# Patient Record
Sex: Female | Born: 1975 | Race: White | Hispanic: No | Smoking: Current every day smoker
Health system: Southern US, Community
[De-identification: ages and names within clinical notes are randomized; demographics above are authoritative.]

## PROBLEM LIST (undated history)

## (undated) HISTORY — PX: ABDOMINAL HYSTERECTOMY: SHX81

---

## 2004-07-18 ENCOUNTER — Emergency Department: Payer: Self-pay | Admitting: Unknown Physician Specialty

## 2004-07-19 ENCOUNTER — Ambulatory Visit: Payer: Self-pay

## 2004-09-22 ENCOUNTER — Emergency Department: Payer: Self-pay | Admitting: General Practice

## 2004-11-26 ENCOUNTER — Emergency Department: Payer: Self-pay | Admitting: Internal Medicine

## 2005-03-19 ENCOUNTER — Emergency Department: Payer: Self-pay | Admitting: Emergency Medicine

## 2005-04-23 ENCOUNTER — Emergency Department: Payer: Self-pay | Admitting: Emergency Medicine

## 2005-06-30 ENCOUNTER — Emergency Department: Payer: Self-pay | Admitting: Emergency Medicine

## 2005-10-26 ENCOUNTER — Emergency Department: Payer: Self-pay | Admitting: Emergency Medicine

## 2005-10-26 ENCOUNTER — Emergency Department: Payer: Self-pay | Admitting: Unknown Physician Specialty

## 2005-11-27 ENCOUNTER — Emergency Department: Payer: Self-pay | Admitting: General Practice

## 2007-03-02 ENCOUNTER — Emergency Department: Payer: Self-pay | Admitting: Emergency Medicine

## 2007-08-01 ENCOUNTER — Emergency Department: Payer: Self-pay | Admitting: Emergency Medicine

## 2007-12-10 ENCOUNTER — Emergency Department: Payer: Self-pay | Admitting: Emergency Medicine

## 2007-12-10 ENCOUNTER — Other Ambulatory Visit: Payer: Self-pay

## 2008-04-06 ENCOUNTER — Emergency Department: Payer: Self-pay | Admitting: Emergency Medicine

## 2009-02-27 ENCOUNTER — Emergency Department: Payer: Self-pay | Admitting: Internal Medicine

## 2010-01-06 ENCOUNTER — Emergency Department: Payer: Self-pay | Admitting: Emergency Medicine

## 2011-03-16 ENCOUNTER — Ambulatory Visit: Payer: Self-pay | Admitting: Family Medicine

## 2012-01-19 ENCOUNTER — Ambulatory Visit: Payer: Self-pay

## 2017-03-16 ENCOUNTER — Emergency Department
Admission: EM | Admit: 2017-03-16 | Discharge: 2017-03-16 | Disposition: A | Discharge status: Home / Self Care | Payer: Medicaid Other | Attending: Emergency Medicine | Admitting: Emergency Medicine

## 2017-03-16 ENCOUNTER — Encounter: Payer: Self-pay | Admitting: Emergency Medicine

## 2017-03-16 DIAGNOSIS — F17210 Nicotine dependence, cigarettes, uncomplicated: Secondary | ICD-10-CM | POA: Insufficient documentation

## 2017-03-16 DIAGNOSIS — L0231 Cutaneous abscess of buttock: Secondary | ICD-10-CM | POA: Insufficient documentation

## 2017-03-16 DIAGNOSIS — L0291 Cutaneous abscess, unspecified: Secondary | ICD-10-CM

## 2017-03-16 MED ORDER — CLINDAMYCIN PHOSPHATE 600 MG/4ML IJ SOLN
600.0000 mg | Freq: Once | INTRAMUSCULAR | Status: AC
  Administered 2017-03-16: 600 mg via INTRAMUSCULAR
  Filled 2017-03-16: qty 4

## 2017-03-16 MED ORDER — CLINDAMYCIN HCL 300 MG PO CAPS: 300.0000 mg | ORAL_CAPSULE | Freq: Three times a day (TID) | ORAL | 0 refills | Status: AC

## 2017-03-16 NOTE — ED Provider Notes (Signed)
[facility] Emergency Department Provider Note  ____________________________________________  Time seen: Approximately 2:18 PM  I have reviewed the triage vital signs and the nursing notes.   HISTORY  Chief Complaint Abscess    HPI Kelly Jacobs is a 41 y.o. female that presents to emergency department with abscess to left buttocks for 1 week. Area is painful. She went to urgent care 3 days ago and was given Bactrim, which is not helping. She denies fever, chills, nausea, vomiting, abdominal pain, drainage from site.   History reviewed. No pertinent past medical history.  There are no active problems to display for this patient.   Past Surgical History:  Procedure Laterality Date  . ABDOMINAL HYSTERECTOMY      Prior to Admission medications   Medication Sig Start Date End Date Taking? Authorizing Provider  clindamycin (CLEOCIN) 300 MG capsule Take 1 capsule (300 mg total) by mouth 3 (three) times daily. 03/16/17 03/26/17  Enid DerryWagner, Noor Vidales, PA-C    Allergies Macrobid [nitrofurantoin]  No family history on file.  Social History Social History  Substance Use Topics  . Smoking status: Current Every Day Smoker    Packs/day: 0.50    Types: Cigarettes  . Smokeless tobacco: Never Used  . Alcohol use No     Review of Systems  Constitutional: No fever/chills Cardiovascular: No chest pain. Respiratory: No SOB. Gastrointestinal: No abdominal pain.  No nausea, no vomiting.  Skin: Negative for  abrasions, lacerations, ecchymosis.   ____________________________________________   PHYSICAL EXAM:  VITAL SIGNS: ED Triage Vitals [03/16/17 1259]  Enc Vitals Group     BP 127/66     Pulse Rate 73     Resp 17     Temp 98.1 F (36.7 C)     Temp Source Oral     SpO2 100 %     Weight 118 lb (53.5 kg)     Height 5\' 4"  (1.626 m)     Head Circumference      Peak Flow      Pain Score 9     Pain Loc      Pain Edu?      Excl. in GC?       Constitutional: Alert and oriented. Well appearing and in no acute distress. Eyes: Conjunctivae are normal. PERRL. EOMI. Head: Atraumatic. ENT:      Ears:      Nose: No congestion/rhinnorhea.      Mouth/Throat: Mucous membranes are moist.  Neck: No stridor.  Cardiovascular: Normal rate, regular rhythm.  Good peripheral circulation. Respiratory: Normal respiratory effort without tachypnea or retractions. Lungs CTAB. Good air entry to the bases with no decreased or absent breath sounds. Musculoskeletal: Full range of motion to all extremities. No gross deformities appreciated. Neurologic:  Normal speech and language. No gross focal neurologic deficits are appreciated.  Skin:  Skin is warm, dry. 1 cm x 1 cm area of swelling and fluctuance with active yellow drainage with 2 inches of surrounding erythema to left buttocks.   ____________________________________________   LABS (all labs ordered are listed, but only abnormal results are displayed)  Labs Reviewed - No data to display ____________________________________________  EKG   ____________________________________________  RADIOLOGY  No results found.  ____________________________________________    PROCEDURES  Procedure(s) performed:    Procedures    Medications  clindamycin (CLEOCIN) injection 600 mg (600 mg Intramuscular Given 03/16/17 1423)     ____________________________________________   INITIAL IMPRESSION / ASSESSMENT AND PLAN / ED COURSE  Pertinent labs &  imaging results that were available during my care of the patient were reviewed by me and considered in my medical decision making (see chart for details).  Review of the [place] CSRS was performed in accordance of the [facility] prior to dispensing any controlled drugs.  Patient's diagnosis is consistent with abscess. Vital signs and exam are reassuring. Abscess started actively draining in ED and drained about 5 ccs. Patient was given IM clindamycin  and will be sent home with a prescription for clindamycin. She is taking Bactrim and also continue this prescription. Patient is to follow up with PCP as directed. Patient is given ED precautions to return to the ED for any worsening or new symptoms.     ____________________________________________  FINAL CLINICAL IMPRESSION(S) / ED DIAGNOSES  Final diagnoses:  Abscess      NEW MEDICATIONS STARTED DURING THIS VISIT:  Discharge Medication List as of 03/16/2017  2:13 PM    START taking these medications   Details  clindamycin (CLEOCIN) 300 MG capsule Take 1 capsule (300 mg total) by mouth 3 (three) times daily., Starting Thu 03/16/2017, Until Sun 03/26/2017, Print            This chart was dictated using voice recognition software/Dragon. Despite best efforts to proofread, errors can occur which can change the meaning. Any change was purely unintentional.    Enid Derry, PA-C 03/16/17 1545    Emily Filbert, MD 03/17/17 [phone]

## 2017-03-16 NOTE — ED Triage Notes (Signed)
Patient presents to ED via POV from home with c/o abscess to left medial buttock. Redness noted around affected area.

## 2017-03-16 NOTE — ED Notes (Signed)
Medication coming from pharmacy. Pt updated.

## 2017-03-16 NOTE — ED Notes (Signed)
Pt states abscess x 1 week on L buttocks. Red and swollen. States already on antibiotic but "the pain isn't getting better." pt appears uncomfortable.

## 2017-08-24 ENCOUNTER — Other Ambulatory Visit: Payer: Self-pay | Admitting: Family Medicine

## 2017-08-24 DIAGNOSIS — Z1231 Encounter for screening mammogram for malignant neoplasm of breast: Secondary | ICD-10-CM

## 2017-09-05 ENCOUNTER — Inpatient Hospital Stay: Admission: RE | Admit: 2017-09-05 | Payer: Medicaid Other | Source: Ambulatory Visit

## 2017-09-12 ENCOUNTER — Inpatient Hospital Stay: Admission: RE | Admit: 2017-09-12 | Payer: Medicaid Other | Source: Ambulatory Visit

## 2017-10-04 ENCOUNTER — Encounter: Payer: Self-pay | Admitting: Emergency Medicine

## 2017-10-04 ENCOUNTER — Emergency Department
Admission: EM | Admit: 2017-10-04 | Discharge: 2017-10-05 | Disposition: A | Discharge status: Home / Self Care | Payer: Self-pay | Attending: Emergency Medicine | Admitting: Emergency Medicine

## 2017-10-04 ENCOUNTER — Emergency Department: Payer: Self-pay

## 2017-10-04 ENCOUNTER — Other Ambulatory Visit: Payer: Self-pay

## 2017-10-04 DIAGNOSIS — F17210 Nicotine dependence, cigarettes, uncomplicated: Secondary | ICD-10-CM | POA: Insufficient documentation

## 2017-10-04 DIAGNOSIS — N12 Tubulo-interstitial nephritis, not specified as acute or chronic: Secondary | ICD-10-CM | POA: Insufficient documentation

## 2017-10-04 LAB — URINALYSIS, COMPLETE (UACMP) WITH MICROSCOPIC
Bilirubin Urine: NEGATIVE
GLUCOSE, UA: NEGATIVE mg/dL
Ketones, ur: NEGATIVE mg/dL
NITRITE: NEGATIVE
PROTEIN: 100 mg/dL — AB
SPECIFIC GRAVITY, URINE: 1.021 (ref 1.005–1.030)
pH: 7 (ref 5.0–8.0)

## 2017-10-04 LAB — CBC
HCT: 39.9 % (ref 35.0–47.0)
HEMOGLOBIN: 13.1 g/dL (ref 12.0–16.0)
MCH: 30.6 pg (ref 26.0–34.0)
MCHC: 33 g/dL (ref 32.0–36.0)
MCV: 92.6 fL (ref 80.0–100.0)
Platelets: 224 10*3/uL (ref 150–440)
RBC: 4.3 MIL/uL (ref 3.80–5.20)
RDW: 13.3 % (ref 11.5–14.5)
WBC: 9.3 10*3/uL (ref 3.6–11.0)

## 2017-10-04 LAB — BASIC METABOLIC PANEL
ANION GAP: 9 (ref 5–15)
BUN: 12 mg/dL (ref 6–20)
CALCIUM: 8.9 mg/dL (ref 8.9–10.3)
CO2: 23 mmol/L (ref 22–32)
Chloride: 107 mmol/L (ref 101–111)
Creatinine, Ser: 0.84 mg/dL (ref 0.44–1.00)
GLUCOSE: 99 mg/dL (ref 65–99)
Potassium: 3.9 mmol/L (ref 3.5–5.1)
SODIUM: 139 mmol/L (ref 135–145)

## 2017-10-04 MED ORDER — MORPHINE SULFATE (PF) 4 MG/ML IV SOLN
4.0000 mg | Freq: Once | INTRAVENOUS | Status: AC
  Administered 2017-10-05: 4 mg via INTRAVENOUS
  Filled 2017-10-04: qty 1

## 2017-10-04 MED ORDER — ONDANSETRON HCL 4 MG/2ML IJ SOLN
4.0000 mg | Freq: Once | INTRAMUSCULAR | Status: AC
  Administered 2017-10-04: 4 mg via INTRAVENOUS
  Filled 2017-10-04: qty 2

## 2017-10-04 MED ORDER — SODIUM CHLORIDE 0.9 % IV BOLUS
1000.0000 mL | Freq: Once | INTRAVENOUS | Status: AC
  Administered 2017-10-04: 1000 mL via INTRAVENOUS

## 2017-10-04 NOTE — ED Notes (Signed)
Patient transported to CT at this time. 

## 2017-10-04 NOTE — ED Notes (Signed)
Dr Brown, and Butch, RN at bedside at this time. 

## 2017-10-04 NOTE — ED Triage Notes (Addendum)
Patient ambulatory to triage with steady gait, without difficulty or distress noted; pt reports today having left flank and pelvic pressure with difficulty urinating and hematuria noted

## 2017-10-04 NOTE — ED Notes (Signed)
Pt reports LEFT-sided flank and suprapubic pain with burning during urination that started today. Pt reports previous h/x of both kidney stones and UTIs; reports current s/x's are similar. Pt refuses IV access at this time when informed by this RN that IV fluids, antiemetics, and pain medications were likely to be ordered. Pt stated, "I don't need anything for pain, I just want to know what's causing it".

## 2017-10-05 MED ORDER — SODIUM CHLORIDE 0.9 % IV SOLN
1.0000 g | Freq: Once | INTRAVENOUS | Status: DC
  Filled 2017-10-05: qty 10

## 2017-10-05 MED ORDER — TRAMADOL HCL 50 MG PO TABS: 50.0000 mg | ORAL_TABLET | Freq: Four times a day (QID) | ORAL | 0 refills | Status: DC | PRN

## 2017-10-05 MED ORDER — CIPROFLOXACIN HCL 500 MG PO TABS
500.0000 mg | ORAL_TABLET | Freq: Once | ORAL | Status: AC
  Administered 2017-10-05: 500 mg via ORAL
  Filled 2017-10-05: qty 1

## 2017-10-05 MED ORDER — CIPROFLOXACIN HCL 500 MG PO TABS: 500.0000 mg | ORAL_TABLET | Freq: Two times a day (BID) | ORAL | 0 refills | Status: AC

## 2017-10-05 NOTE — ED Provider Notes (Signed)
[facility] Emergency Department Provider Note   First MD Initiated Contact with Patient 10/04/17 2336     (approximate)  I have reviewed the triage vital signs and the nursing notes.   HISTORY  Chief Complaint Flank Pain   HPI Kelly Jacobs is a 42 y.o. female presents with 10 out of 10 left flank pain dysuria hematuria which began today.  Patient admits to pelvic pressure as well.  Patient denies any fever afebrile on presentation.  Patient denies any nausea or vomiting.  Patient does admit to previous history of kidney stones and UTI.   Past medical history Kidney stone UTI There are no active problems to display for this patient.   Past Surgical History:  Procedure Laterality Date  . ABDOMINAL HYSTERECTOMY      Prior to Admission medications   Not on File    Allergies Flexeril [cyclobenzaprine]; Macrobid [nitrofurantoin]; Percocet [oxycodone-acetaminophen]; and Ceftriaxone  Family history Noncontributory  Social History Social History   Tobacco Use  . Smoking status: Current Every Day Smoker    Packs/day: 0.50    Types: Cigarettes  . Smokeless tobacco: Never Used  Substance Use Topics  . Alcohol use: No  . Drug use: No    Review of Systems Constitutional: No fever/chills Eyes: No visual changes. ENT: No sore throat. Cardiovascular: Denies chest pain. Respiratory: Denies shortness of breath. Gastrointestinal: No abdominal pain.  No nausea, no vomiting.  No diarrhea.  No constipation. Genitourinary: Positive for left flank pain dysuria hematuria. Musculoskeletal: Negative for neck pain.  Negative for back pain. Integumentary: Negative for rash. Neurological: Negative for headaches, focal weakness or numbness.   ____________________________________________   PHYSICAL EXAM:  VITAL SIGNS: ED Triage Vitals  Enc Vitals Group     BP 10/04/17 2134 (!) 147/89     Pulse Rate 10/04/17 2134 70     Resp 10/04/17 2134 20   Temp 10/04/17 2134 97.8 F (36.6 C)     Temp Source 10/04/17 2134 Oral     SpO2 10/04/17 2134 98 %     Weight 10/04/17 2132 50.3 kg (111 lb)     Height 10/04/17 2132 1.6 m (5\' 3" )     Head Circumference --      Peak Flow --      Pain Score 10/04/17 2133 9     Pain Loc --      Pain Edu? --      Excl. in GC? --     Constitutional: Alert and oriented. Well appearing and in no acute distress. Eyes: Conjunctivae are normal.  Head: Atraumatic. Mouth/Throat: Mucous membranes are moist.  Oropharynx non-erythematous. Neck: No stridor.   Cardiovascular: Normal rate, regular rhythm. Good peripheral circulation. Grossly normal heart sounds. Respiratory: Normal respiratory effort.  No retractions. Lungs CTAB. Gastrointestinal: Soft and nontender. No distention.  Positive for left CVA tenderness Musculoskeletal: No lower extremity tenderness nor edema. No gross deformities of extremities. Neurologic:  Normal speech and language. No gross focal neurologic deficits are appreciated.  Skin:  Skin is warm, dry and intact. No rash noted. Psychiatric: Mood and affect are normal. Speech and behavior are normal.  ____________________________________________   LABS (all labs ordered are listed, but only abnormal results are displayed)  Labs Reviewed  URINALYSIS, COMPLETE (UACMP) WITH MICROSCOPIC - Abnormal; Notable for the following components:      Result Value   Color, Urine YELLOW (*)    APPearance CLOUDY (*)    Hgb urine dipstick LARGE (*)  Protein, ur 100 (*)    Leukocytes, UA MODERATE (*)    Bacteria, UA RARE (*)    Squamous Epithelial / LPF 6-30 (*)    All other components within normal limits  BASIC METABOLIC PANEL  CBC     RADIOLOGY I,  N Mihail Prettyman, personally viewed and evaluated these images (plain radiographs) as part of my medical decision making, as well as reviewing the written report by the radiologist.  ED MD interpretation: CT scan revealed no evidence of  nephrolithiasis or ureterolithiasis per radiologist.  Official radiology report(s): Ct Renal Stone Study  Result Date: 10/05/2017 CLINICAL DATA:  42 year old female with left flank pain. EXAM: CT ABDOMEN AND PELVIS WITHOUT CONTRAST TECHNIQUE: Multidetector CT imaging of the abdomen and pelvis was performed following the standard protocol without IV contrast. COMPARISON:  CT of the abdomen pelvis dated 08/02/2007 FINDINGS: Evaluation of this exam is limited in the absence of intravenous contrast. Lower chest: The visualized lung bases are clear. No intra-abdominal free air or free fluid. Hepatobiliary: No focal liver abnormality is seen. No gallstones, gallbladder wall thickening, or biliary dilatation. Pancreas: Unremarkable. No pancreatic ductal dilatation or surrounding inflammatory changes. Spleen: Normal in size without focal abnormality. Adrenals/Urinary Tract: The adrenal glands, kidneys, and visualized ureters appear unremarkable. The urinary bladder is collapsed. Stomach/Bowel: Stomach is within normal limits. Appendix appears normal. No evidence of bowel wall thickening, distention, or inflammatory changes. Vascular/Lymphatic: The abdominal aorta and IVC appear unremarkable on this noncontrast CT. No portal venous gas. There is no adenopathy. Reproductive: Hysterectomy. The ovaries are grossly unremarkable as visualized. Other: None Musculoskeletal: No acute or significant osseous findings. IMPRESSION: No acute intra-abdominopelvic pathology. No hydronephrosis or nephrolithiasis. Electronically Signed   By: Elgie Collard M.D.   On: 10/05/2017 00:07     Procedures   ____________________________________________   INITIAL IMPRESSION / ASSESSMENT AND PLAN / ED COURSE  As part of my medical decision making, I reviewed the following data within the electronic MEDICAL RECORD NUMBER  42 year old female presented with above-stated history and physical exam concerning for pyelonephritis and possibly  ureterolithiasis as such CT scan of the abdomen pelvis was performed which revealed no evidence of ureterolithiasis.  Patient's urinalysis consistent with UTI and as such we will treat for pyelonephritis.  Patient given ciprofloxacin in the emergency department will be prescribed the same for home.  __________________________________  FINAL CLINICAL IMPRESSION(S) / ED DIAGNOSES  Final diagnoses:  Pyelonephritis     MEDICATIONS GIVEN DURING THIS VISIT:  Medications  morphine 4 MG/ML injection 4 mg (4 mg Intravenous Given 10/05/17 0004)  ondansetron (ZOFRAN) injection 4 mg (4 mg Intravenous Given 10/04/17 2350)  sodium chloride 0.9 % bolus 1,000 mL (1,000 mLs Intravenous New Bag/Given 10/04/17 2350)  ciprofloxacin (CIPRO) tablet 500 mg (500 mg Oral Given 10/05/17 0056)     ED Discharge Orders    None       Note:  This document was prepared using Dragon voice recognition software and may include unintentional dictation errors.    Darci Current, MD 10/05/17 [phone]

## 2017-12-13 ENCOUNTER — Emergency Department: Payer: Medicaid Other

## 2017-12-13 ENCOUNTER — Encounter: Payer: Self-pay | Admitting: *Deleted

## 2017-12-13 ENCOUNTER — Other Ambulatory Visit: Payer: Self-pay

## 2017-12-13 ENCOUNTER — Emergency Department
Admission: EM | Admit: 2017-12-13 | Discharge: 2017-12-14 | Disposition: A | Discharge status: Home / Self Care | Payer: Medicaid Other | Attending: Emergency Medicine | Admitting: Emergency Medicine

## 2017-12-13 DIAGNOSIS — R079 Chest pain, unspecified: Secondary | ICD-10-CM

## 2017-12-13 DIAGNOSIS — F17210 Nicotine dependence, cigarettes, uncomplicated: Secondary | ICD-10-CM | POA: Insufficient documentation

## 2017-12-13 DIAGNOSIS — Z79899 Other long term (current) drug therapy: Secondary | ICD-10-CM | POA: Insufficient documentation

## 2017-12-13 DIAGNOSIS — R0789 Other chest pain: Secondary | ICD-10-CM | POA: Insufficient documentation

## 2017-12-13 LAB — CBC
HEMATOCRIT: 39.5 % (ref 35.0–47.0)
Hemoglobin: 13.4 g/dL (ref 12.0–16.0)
MCH: 31.6 pg (ref 26.0–34.0)
MCHC: 33.9 g/dL (ref 32.0–36.0)
MCV: 93.3 fL (ref 80.0–100.0)
Platelets: 218 10*3/uL (ref 150–440)
RBC: 4.23 MIL/uL (ref 3.80–5.20)
RDW: 13.4 % (ref 11.5–14.5)
WBC: 9.8 10*3/uL (ref 3.6–11.0)

## 2017-12-13 NOTE — ED Triage Notes (Signed)
Pt brought in via ems with chest pain x 30 minutes.  No n/v/d.  Pt reports sob.  cig smoker.  Pt has left side chest pain.  Pt alert.  Iv in place.

## 2017-12-13 NOTE — ED Provider Notes (Signed)
[facility] Emergency Department Provider Note  ____________________________________________  Time seen: Approximately 11:16 PM  I have reviewed the triage vital signs and the nursing notes.   HISTORY  Chief Complaint Chest Pain    HPI Kelly Jacobs is a 42 y.o. female with no significant past medical history who reports gradual onset of left-sided chest pain radiating to her neck and left arm that started at about 9:30 PM tonight. While she was driving in the car became much worse. It is waxing and waning, constant. Never had anything like this before. No recent travel, hospitalization or surgery. No shortness of breath or vomiting or diaphoresis. Hurts to take a deep breath. No history of DVT or PE. She does report a history of anxiety      History reviewed. No pertinent past medical history.   There are no active problems to display for this patient.    Past Surgical History:  Procedure Laterality Date  . ABDOMINAL HYSTERECTOMY       Prior to Admission medications   Medication Sig Start Date End Date Taking? Authorizing Provider  traMADol (ULTRAM) 50 MG tablet Take 1 tablet (50 mg total) by mouth every 6 (six) hours as needed. 10/05/17 10/05/18  Darci Current, MD     Allergies Flexeril [cyclobenzaprine]; Macrobid [nitrofurantoin]; Percocet [oxycodone-acetaminophen]; and Ceftriaxone   No family history on file.  Social History Social History   Tobacco Use  . Smoking status: Current Every Day Smoker    Packs/day: 0.50    Types: Cigarettes  . Smokeless tobacco: Never Used  Substance Use Topics  . Alcohol use: No  . Drug use: No    Review of Systems  Constitutional:   No fever or chills.  ENT:   No sore throat. No rhinorrhea. Cardiovascular:   positive as above chest pain without syncope. Respiratory:   No dyspnea or cough. Gastrointestinal:   Negative for abdominal pain, vomiting and diarrhea.  Musculoskeletal:   Negative for  focal pain or swelling All other systems reviewed and are negative except as documented above in ROS and HPI.  ____________________________________________   PHYSICAL EXAM:  VITAL SIGNS: ED Triage Vitals  Enc Vitals Group     BP 12/13/17 2209 128/71     Pulse Rate 12/13/17 2209 88     Resp 12/13/17 2209 20     Temp 12/13/17 2209 98.2 F (36.8 C)     Temp Source 12/13/17 2209 Oral     SpO2 12/13/17 2209 98 %     Weight 12/13/17 2210 118 lb (53.5 kg)     Height 12/13/17 2210 5\' 3"  (1.6 m)     Head Circumference --      Peak Flow --      Pain Score 12/13/17 2209 7     Pain Loc --      Pain Edu? --      Excl. in GC? --     Vital signs reviewed, nursing assessments reviewed.   Constitutional:   Alert and oriented.  Eyes:   Conjunctivae are normal. EOMI. PERRL. ENT      Head:   Normocephalic and atraumatic.      Nose:   No congestion/rhinnorhea.       Mouth/Throat:   MMM, no pharyngeal erythema. No peritonsillar mass.       Neck:   No meningismus. Full ROM. Hematological/Lymphatic/Immunilogical:   No cervical lymphadenopathy. Cardiovascular:   RRR. Symmetric bilateral radial and DP pulses.  No murmurs.  Respiratory:  Normal respiratory effort without tachypnea/retractions. Breath sounds are clear and equal bilaterally. No wheezes/rales/rhonchi. Gastrointestinal:   Soft and nontender. Non distended. There is no CVA tenderness.  No rebound, rigidity, or guarding.  Musculoskeletal:   Normal range of motion in all extremities. No joint effusions.  No lower extremity tenderness.  No edema. Neurologic:   Normal speech and language.  Motor grossly intact. No acute focal neurologic deficits are appreciated.  Skin:    Skin is warm, dry and intact. No rash noted.  No petechiae, purpura, or bullae.  ____________________________________________    LABS (pertinent positives/negatives) (all labs ordered are listed, but only abnormal results are displayed) Labs Reviewed  CBC   BASIC METABOLIC PANEL  TROPONIN I  POC URINE PREG, ED   ____________________________________________   EKG  interpreted by me  Date: 12/13/2017  Rate: 74  Rhythm: normal sinus rhythm  QRS Axis: normal  Intervals: normal  ST/T Wave abnormalities: normal  Conduction Disutrbances: none  Narrative Interpretation: unremarkable  repeat EKG performed at 10:52 PM  Date: 12/13/2017  Rate: 58  Rhythm: normal sinus rhythm  QRS Axis: normal  Intervals: normal  ST/T Wave abnormalities: normal  Conduction Disutrbances: none  Narrative Interpretation: unremarkable. no interval change   EKG is similar in appearance to previous EKG done in 2011.       ____________________________________________    RADIOLOGY  Dg Chest 2 View  Result Date: 12/13/2017 CLINICAL DATA:  Chest pain for 30 minutes.  Shortness of breath. EXAM: CHEST - 2 VIEW COMPARISON:  Chest radiograph December 11, 2007 FINDINGS: Cardiomediastinal silhouette is normal. No pleural effusions or focal consolidations. Mild bronchitic changes. Trachea projects midline and there is no pneumothorax. Soft tissue planes and included osseous structures are non-suspicious. IMPRESSION: Mild bronchitic changes without focal consolidation. Electronically Signed   By: Awilda Metroourtnay  Bloomer M.D.   On: 12/13/2017 22:48    ____________________________________________   PROCEDURES Procedures  ____________________________________________  DIFFERENTIAL DIAGNOSIS   anxiety, bronchospasm, non-STEMI. Doubt PE or dissection. Doubt pneumothorax pericarditis or pneumonia  CLINICAL IMPRESSION / ASSESSMENT AND PLAN / ED COURSE  Pertinent labs & imaging results that were available during my care of the patient were reviewed by me and considered in my medical decision making (see chart for details).    patient is nontoxic, presents with atypical chest pain. Normal vital signs. EKG shows some somewhat peaked T waves in the anterior leads. Overall  nondiagnostic and nonischemic, but out of concern that this may represent some early acute T waves, we'll plan to obtain 2 troponins in the ED across a 4 hour window from symptom onset. If negative I think the patient is suitable for discharge home. Case is signed out to Dr. Manson PasseyBrown to follow-up on the labs.      ____________________________________________   FINAL CLINICAL IMPRESSION(S) / ED DIAGNOSES    Final diagnoses:  Atypical chest pain     ED Discharge Orders    None      Portions of this note were generated with dragon dictation software. Dictation errors may occur despite best attempts at proofreading.    Sharman CheekStafford, Sadey Yandell, MD 12/13/17 2321

## 2017-12-14 LAB — BASIC METABOLIC PANEL
ANION GAP: 6 (ref 5–15)
BUN: 11 mg/dL (ref 6–20)
CALCIUM: 8.4 mg/dL — AB (ref 8.9–10.3)
CO2: 24 mmol/L (ref 22–32)
Chloride: 109 mmol/L (ref 101–111)
Creatinine, Ser: 0.79 mg/dL (ref 0.44–1.00)
Glucose, Bld: 108 mg/dL — ABNORMAL HIGH (ref 65–99)
Potassium: 3.6 mmol/L (ref 3.5–5.1)
Sodium: 139 mmol/L (ref 135–145)

## 2017-12-14 LAB — POCT PREGNANCY, URINE: PREG TEST UR: NEGATIVE

## 2017-12-14 LAB — TROPONIN I

## 2017-12-14 NOTE — ED Provider Notes (Signed)
Assumed care of the patient from Dr. Scotty CourtStafford 11:00 PM.  Patient's laboratory data unremarkable including troponin x1.  Patient is requesting to be discharged from the hospital at this time.  I informed the patient of the necessity of 2 troponins however she is adamant that she would like to leave.  Patient denies any chest pain at present no shortness of breath or any other symptoms.   Darci CurrentBrown, Horace N, MD 12/14/17 [phone]

## 2017-12-14 NOTE — ED Notes (Signed)
Ok'd by Dr. Manson PasseyBrown to wait to draw D-dimer until next Troponin due.

## 2017-12-23 ENCOUNTER — Encounter: Payer: Self-pay | Admitting: *Deleted

## 2017-12-23 ENCOUNTER — Other Ambulatory Visit: Payer: Self-pay

## 2017-12-23 ENCOUNTER — Emergency Department
Admission: EM | Admit: 2017-12-23 | Discharge: 2017-12-23 | Disposition: A | Discharge status: Home / Self Care | Payer: Medicaid Other | Attending: Emergency Medicine | Admitting: Emergency Medicine

## 2017-12-23 ENCOUNTER — Emergency Department: Payer: Medicaid Other

## 2017-12-23 DIAGNOSIS — S20212A Contusion of left front wall of thorax, initial encounter: Secondary | ICD-10-CM

## 2017-12-23 DIAGNOSIS — F17210 Nicotine dependence, cigarettes, uncomplicated: Secondary | ICD-10-CM | POA: Insufficient documentation

## 2017-12-23 DIAGNOSIS — W11XXXA Fall on and from ladder, initial encounter: Secondary | ICD-10-CM | POA: Insufficient documentation

## 2017-12-23 DIAGNOSIS — S298XXA Other specified injuries of thorax, initial encounter: Secondary | ICD-10-CM | POA: Insufficient documentation

## 2017-12-23 DIAGNOSIS — Y998 Other external cause status: Secondary | ICD-10-CM | POA: Insufficient documentation

## 2017-12-23 DIAGNOSIS — Y9289 Other specified places as the place of occurrence of the external cause: Secondary | ICD-10-CM | POA: Insufficient documentation

## 2017-12-23 DIAGNOSIS — Y93E5 Activity, floor mopping and cleaning: Secondary | ICD-10-CM | POA: Insufficient documentation

## 2017-12-23 IMAGING — CR DG RIBS W/ CHEST 3+V*L*
5 series · 5 of 5 positions shown · non-contrast
Comparison: Chest radiograph [DATE]

CLINICAL DATA: Fall from ladder.  LEFT rib pain.

EXAM:
LEFT RIBS AND CHEST - 3+ VIEW

[chest pa]
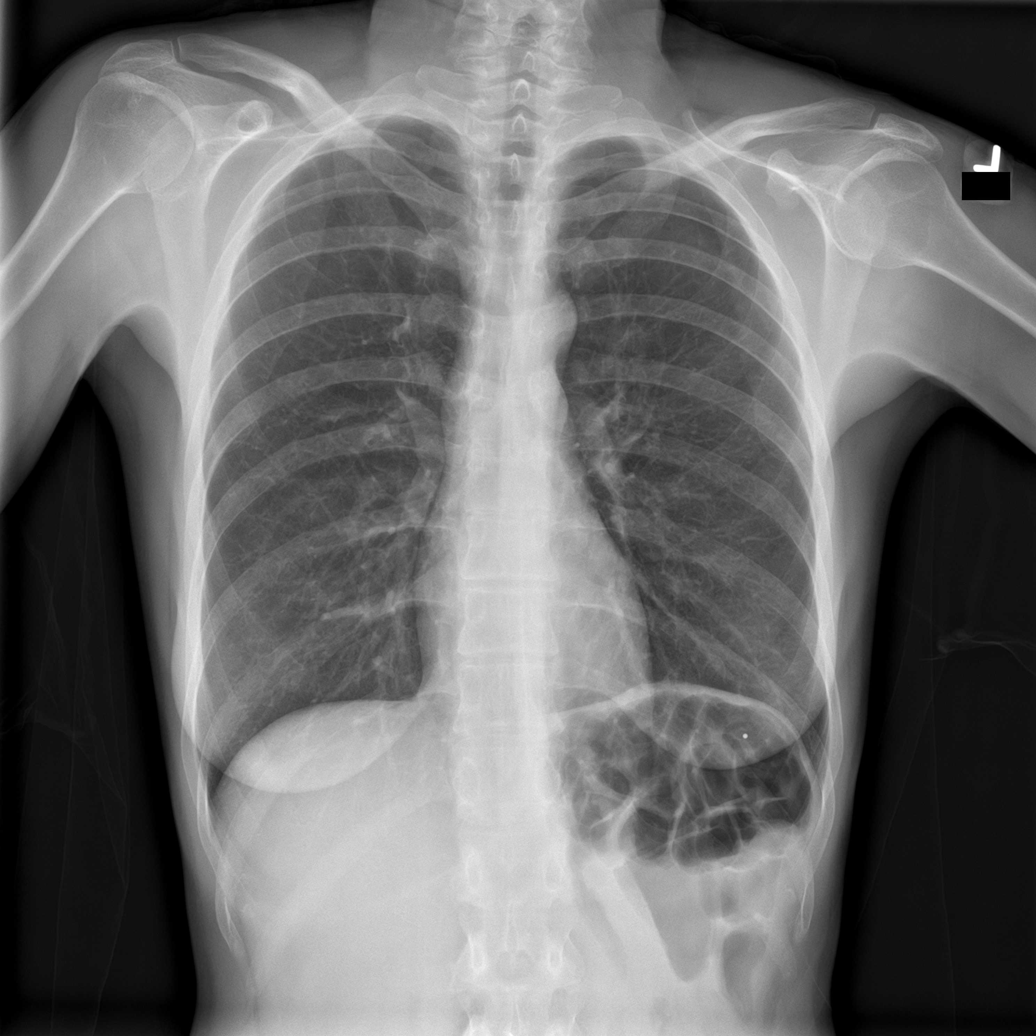

[rib pa (1 of 2)]
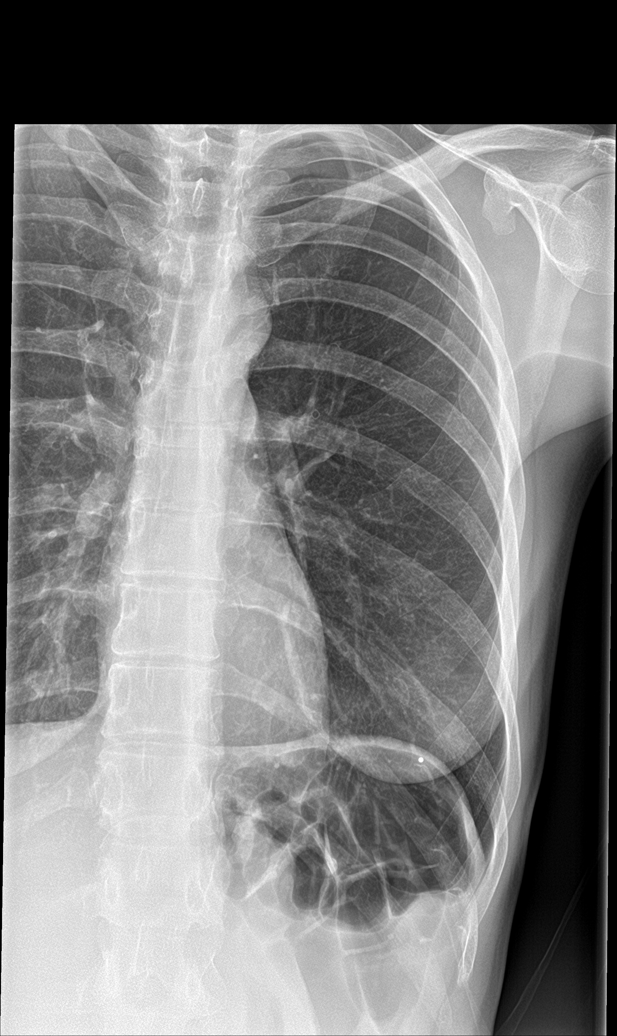

[rib pa obl (1 of 2)]
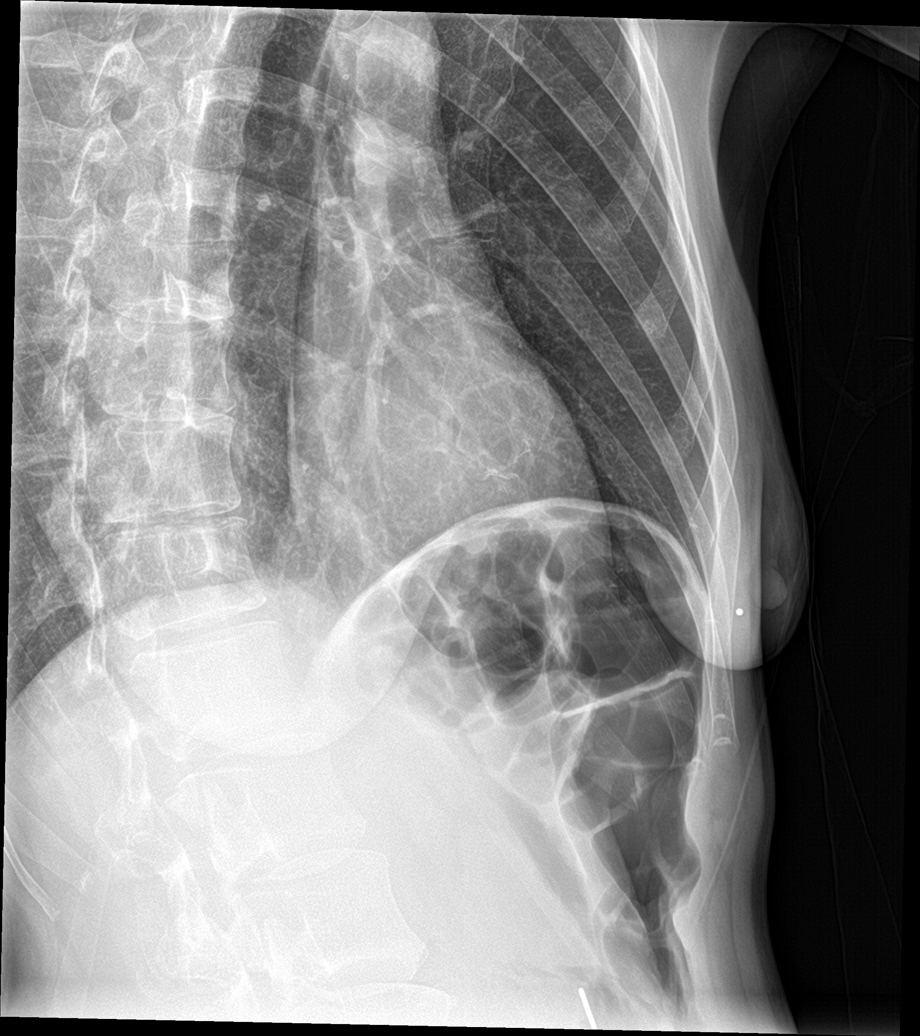

[rib pa (2 of 2)]
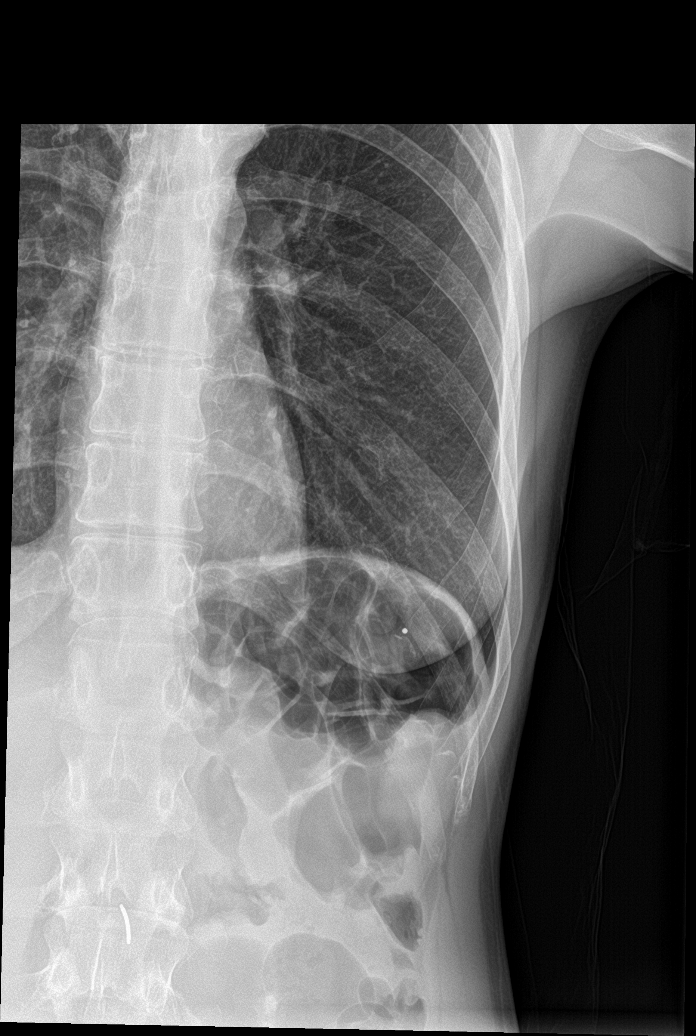

[rib pa obl (2 of 2)]
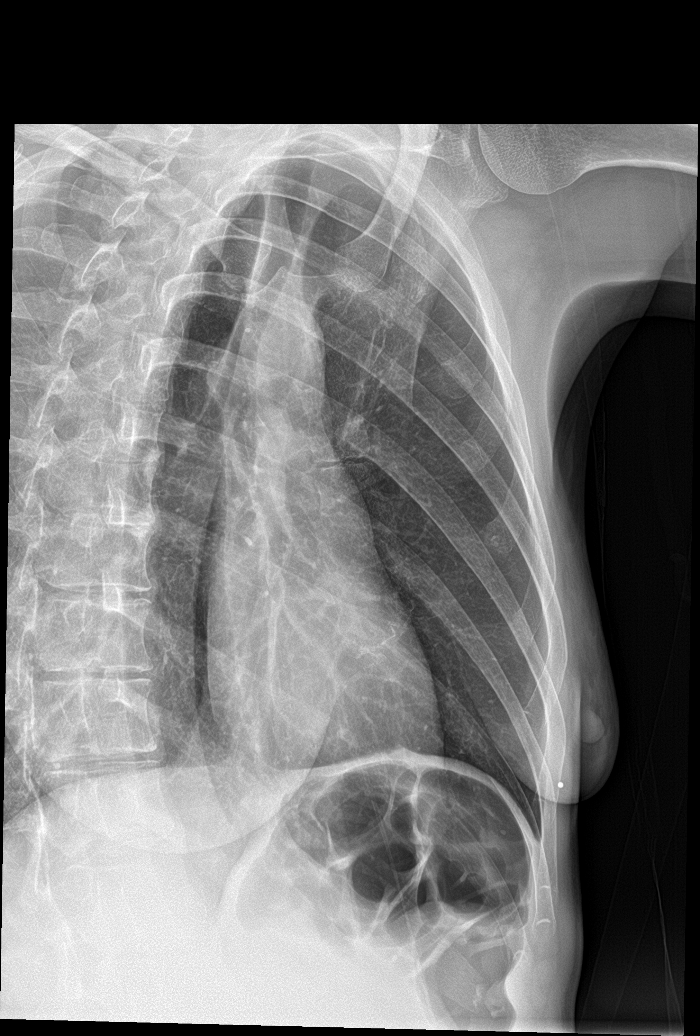

[5 of 5 positions shown; findings below may reference images not displayed]

FINDINGS: No fracture or other bone lesions are seen involving the ribs. There
is no evidence of pneumothorax or pleural effusion. Chronic mild
bronchitic changes. Both lungs are clear. Heart size and mediastinal
contours are within normal limits.
IMPRESSION: No acute rib fracture deformity.

Chronic mild bronchitic changes.

## 2017-12-23 NOTE — ED Provider Notes (Signed)
[facility] Emergency Department Provider Note   ____________________________________________   First MD Initiated Contact with Patient 12/23/17 0240     (approximate)  I have reviewed the triage vital signs and the nursing notes.   HISTORY  Chief Complaint Chest Pain (left rib)    HPI Kelly Jacobs is a 42 y.o. female who presents to the ED from home with a chief complaint of left rib pain.  Patient reports she was cleaning her windows and fell off a 5 foot ladder approximately 3 steps before she caught herself on her left side.  Complains of left rib pain.  Denies striking head or LOC.  Took ibuprofen 2 hours prior to arrival.  Denies headache, neck pain, chest pain, shortness of breath, abdominal pain, nausea or vomiting.  Denies use of anticoagulants.   Past medical history None  There are no active problems to display for this patient.   Past Surgical History:  Procedure Laterality Date  . ABDOMINAL HYSTERECTOMY      Prior to Admission medications   Not on File    Allergies Flexeril [cyclobenzaprine]; Macrobid [nitrofurantoin]; Percocet [oxycodone-acetaminophen]; and Ceftriaxone  No family history on file.  Social History Social History   Tobacco Use  . Smoking status: Current Every Day Smoker    Packs/day: 0.50    Types: Cigarettes  . Smokeless tobacco: Never Used  Substance Use Topics  . Alcohol use: No  . Drug use: No    Review of Systems  Constitutional: No fever/chills Eyes: No visual changes. ENT: No sore throat. Cardiovascular: Denies chest pain. Respiratory: Positive for left rib pain.  Denies shortness of breath. Gastrointestinal: No abdominal pain.  No nausea, no vomiting.  No diarrhea.  No constipation. Genitourinary: Negative for dysuria. Musculoskeletal: Negative for back pain. Skin: Negative for rash. Neurological: Negative for headaches, focal weakness or  numbness.   ____________________________________________   PHYSICAL EXAM:  VITAL SIGNS: ED Triage Vitals  Enc Vitals Group     BP 12/23/17 0038 (!) 125/59     Pulse Rate 12/23/17 0038 70     Resp 12/23/17 0038 20     Temp 12/23/17 0038 97.8 F (36.6 C)     Temp Source 12/23/17 0038 Oral     SpO2 12/23/17 0038 100 %     Weight --      Height --      Head Circumference --      Peak Flow --      Pain Score 12/23/17 0042 8     Pain Loc --      Pain Edu? --      Excl. in GC? --     Constitutional: Alert and oriented. Well appearing and in mild acute distress. Eyes: Conjunctivae are normal. PERRL. EOMI. Head: Atraumatic. Nose: No congestion/rhinnorhea. Mouth/Throat: Mucous membranes are moist.  Oropharynx non-erythematous. Neck: No stridor.  No cervical spine tenderness to palpation. Cardiovascular: Normal rate, regular rhythm. Grossly normal heart sounds.  Good peripheral circulation. Respiratory: Normal respiratory effort.  No retractions. Lungs CTAB.  Splinting.  No crepitus.  Left anterior lower ribs mildly swollen and tender to palpation. Gastrointestinal: Soft and nontender to light or deep palpation.  There is no left upper quadrant abdominal tenderness to palpation.. No distention. No abdominal bruits. No CVA tenderness. Musculoskeletal: No lower extremity tenderness nor edema.  No joint effusions. Neurologic:  Normal speech and language. No gross focal neurologic deficits are appreciated. No gait instability. Skin:  Skin is warm, dry and  intact. No rash noted. Psychiatric: Mood and affect are normal. Speech and behavior are normal.  ____________________________________________   LABS (all labs ordered are listed, but only abnormal results are displayed)  Labs Reviewed - No data to display ____________________________________________  EKG  None ____________________________________________  RADIOLOGY  ED MD interpretation: No rib fracture, no  pneumothorax  Official radiology report(s): Dg Ribs Unilateral W/chest Left  Result Date: 12/23/2017 CLINICAL DATA:  Fall from ladder.  LEFT rib pain. EXAM: LEFT RIBS AND CHEST - 3+ VIEW COMPARISON:  Chest radiograph December 13, 2017 FINDINGS: No fracture or other bone lesions are seen involving the ribs. There is no evidence of pneumothorax or pleural effusion. Chronic mild bronchitic changes. Both lungs are clear. Heart size and mediastinal contours are within normal limits. IMPRESSION: No acute rib fracture deformity. Chronic mild bronchitic changes. Electronically Signed   By: Awilda Metroourtnay  Bloomer M.D.   On: 12/23/2017 01:33    ____________________________________________   PROCEDURES  Procedure(s) performed: None  Procedures  Critical Care performed: No  ____________________________________________   INITIAL IMPRESSION / ASSESSMENT AND PLAN / ED COURSE  As part of my medical decision making, I reviewed the following data within the electronic MEDICAL RECORD NUMBER Nursing notes reviewed and incorporated, Old chart reviewed, Radiograph reviewed and Notes from prior ED visits   42 year old female who presents with left rib pain after fall.  No rib fracture or pneumothorax seen on x-ray.  Patient took ibuprofen prior to arrival and declines opiate analgesia. Will discharge home with incentive spirometer.  Strict return precautions given.  Patient verbalizes understanding and agrees with plan of care.      ____________________________________________   FINAL CLINICAL IMPRESSION(S) / ED DIAGNOSES  Final diagnoses:  Blunt chest trauma, initial encounter  Rib contusion, left, initial encounter     ED Discharge Orders    None       Note:  This document was prepared using Dragon voice recognition software and may include unintentional dictation errors.    Irean HongSung, Haruto Demaria J, MD 12/23/17 0630

## 2017-12-23 NOTE — Discharge Instructions (Addendum)
1.  You may take Tylenol and/or ibuprofen as needed for pain. 2.  Use incentive spirometer as instructed. 3.  Return to the ER for worsening symptoms, persistent vomiting, difficulty breathing, fever or other concerns.

## 2017-12-23 NOTE — ED Triage Notes (Signed)
Pt arrives to triage, reports she was cleaning windows and fell off an approx 5 ft ladder- fell approx 3 steps before she caught her self on the left side on the ladder. C/o left rib pain, took ibuprofen 800 mg 2 hours ago.

## 2018-03-09 ENCOUNTER — Emergency Department
Admission: EM | Admit: 2018-03-09 | Discharge: 2018-03-09 | Disposition: A | Discharge status: Home / Self Care | Payer: Medicaid Other | Attending: Emergency Medicine | Admitting: Emergency Medicine

## 2018-03-09 ENCOUNTER — Encounter: Payer: Self-pay | Admitting: Emergency Medicine

## 2018-03-09 ENCOUNTER — Other Ambulatory Visit: Payer: Self-pay

## 2018-03-09 ENCOUNTER — Emergency Department: Payer: Medicaid Other

## 2018-03-09 DIAGNOSIS — M7989 Other specified soft tissue disorders: Secondary | ICD-10-CM

## 2018-03-09 DIAGNOSIS — T148XXA Other injury of unspecified body region, initial encounter: Secondary | ICD-10-CM

## 2018-03-09 DIAGNOSIS — R2242 Localized swelling, mass and lump, left lower limb: Secondary | ICD-10-CM | POA: Insufficient documentation

## 2018-03-09 DIAGNOSIS — W57XXXA Bitten or stung by nonvenomous insect and other nonvenomous arthropods, initial encounter: Secondary | ICD-10-CM | POA: Insufficient documentation

## 2018-03-09 DIAGNOSIS — F17210 Nicotine dependence, cigarettes, uncomplicated: Secondary | ICD-10-CM | POA: Insufficient documentation

## 2018-03-09 LAB — CBC WITH DIFFERENTIAL/PLATELET
BASOS ABS: 0.1 10*3/uL (ref 0–0.1)
Basophils Relative: 1 %
Eosinophils Absolute: 0.2 10*3/uL (ref 0–0.7)
Eosinophils Relative: 2 %
HCT: 36.6 % (ref 35.0–47.0)
Hemoglobin: 12.7 g/dL (ref 12.0–16.0)
LYMPHS ABS: 1.8 10*3/uL (ref 1.0–3.6)
LYMPHS PCT: 22 %
MCH: 32.3 pg (ref 26.0–34.0)
MCHC: 34.8 g/dL (ref 32.0–36.0)
MCV: 92.7 fL (ref 80.0–100.0)
Monocytes Absolute: 0.6 10*3/uL (ref 0.2–0.9)
Monocytes Relative: 8 %
NEUTROS PCT: 67 %
Neutro Abs: 5.5 10*3/uL (ref 1.4–6.5)
Platelets: 204 10*3/uL (ref 150–440)
RBC: 3.95 MIL/uL (ref 3.80–5.20)
RDW: 13 % (ref 11.5–14.5)
WBC: 8.1 10*3/uL (ref 3.6–11.0)

## 2018-03-09 LAB — COMPREHENSIVE METABOLIC PANEL
ALK PHOS: 51 U/L (ref 38–126)
ALT: 9 U/L (ref 0–44)
AST: 16 U/L (ref 15–41)
Albumin: 4.2 g/dL (ref 3.5–5.0)
Anion gap: 9 (ref 5–15)
BUN: 13 mg/dL (ref 6–20)
CHLORIDE: 108 mmol/L (ref 98–111)
CO2: 25 mmol/L (ref 22–32)
Calcium: 9.3 mg/dL (ref 8.9–10.3)
Creatinine, Ser: 0.85 mg/dL (ref 0.44–1.00)
GLUCOSE: 93 mg/dL (ref 70–99)
Potassium: 3.4 mmol/L — ABNORMAL LOW (ref 3.5–5.1)
Sodium: 142 mmol/L (ref 135–145)
Total Bilirubin: 0.6 mg/dL (ref 0.3–1.2)
Total Protein: 7.7 g/dL (ref 6.5–8.1)

## 2018-03-09 LAB — FIBRINOGEN: Fibrinogen: 249 mg/dL (ref 210–475)

## 2018-03-09 MED ORDER — KETOROLAC TROMETHAMINE 30 MG/ML IJ SOLN
15.0000 mg | Freq: Once | INTRAMUSCULAR | Status: AC
  Administered 2018-03-09: 15 mg via INTRAVENOUS
  Filled 2018-03-09: qty 1

## 2018-03-09 MED ORDER — DIPHENHYDRAMINE HCL 50 MG/ML IJ SOLN
50.0000 mg | Freq: Once | INTRAMUSCULAR | Status: AC
  Administered 2018-03-09: 50 mg via INTRAVENOUS
  Filled 2018-03-09: qty 1

## 2018-03-09 MED ORDER — HYDROCODONE-ACETAMINOPHEN 5-325 MG PO TABS: 1.0000 | ORAL_TABLET | Freq: Two times a day (BID) | ORAL | 0 refills | Status: DC | PRN

## 2018-03-09 MED ORDER — DOXYCYCLINE HYCLATE 100 MG PO CAPS: 100.0000 mg | ORAL_CAPSULE | Freq: Two times a day (BID) | ORAL | 0 refills | Status: DC

## 2018-03-09 MED ORDER — HYDROMORPHONE HCL 1 MG/ML IJ SOLN
1.0000 mg | Freq: Once | INTRAMUSCULAR | Status: DC
  Filled 2018-03-09: qty 1

## 2018-03-09 MED ORDER — FENTANYL CITRATE (PF) 100 MCG/2ML IJ SOLN
100.0000 ug | Freq: Once | INTRAMUSCULAR | Status: DC
  Filled 2018-03-09: qty 2

## 2018-03-09 MED ORDER — PREDNISONE 10 MG (21) PO TBPK: ORAL_TABLET | Freq: Every day | ORAL | 0 refills | Status: DC

## 2018-03-09 NOTE — ED Provider Notes (Signed)
Patient has not had any progression of swelling on the dorsum of the left foot.  It is unclear what this actually is.  We will try steroids as well as Keflex and she is cleared for outpatient follow-up for recheck.   Kelly Jacobs, Wassim Kirksey E, MD 03/09/18 1213

## 2018-03-09 NOTE — ED Triage Notes (Signed)
Pt to room 9 via w/c, appears uncomfortable; reports 4hrs PTA was outside and possibly bit by unknown; now with pain and large amount swelling to left foot

## 2018-03-09 NOTE — ED Provider Notes (Signed)
[facility] Emergency Department Provider Note  ____________________________________________   First MD Initiated Contact with Patient 03/09/18 [phone]     (approximate)  I have reviewed the triage vital signs and the nursing notes.   HISTORY  Chief Complaint Animal Bite   HPI Montine CircleRhonda P Jacobs is a 42 y.o. female who self presents the emergency department with severe throbbing aching pain to the dorsal aspect of her left foot.  4 hours prior to arrival she was standing in flip-flops outside on the grass when she saw several aunts on her foot and did not think anything of it.  Over the course of the past 4 hours she has had insidious onset slowly progressive now severe pain to the dorsal aspect of her foot.  She is not sure if she was bit by a spider or a snake.  She has no itching.  The rash on her foot has been rapidly progressive which prompted the visit today.  She is concerned because after having the aunts on her feet she and her partner had sexual intercourse on the lawn and she is not entirely sure what might of bit her.    History reviewed. No pertinent past medical history.  There are no active problems to display for this patient.   Past Surgical History:  Procedure Laterality Date  . ABDOMINAL HYSTERECTOMY      Prior to Admission medications   Not on File    Allergies Flexeril [cyclobenzaprine]; Macrobid [nitrofurantoin]; Percocet [oxycodone-acetaminophen]; and Ceftriaxone  No family history on file.  Social History Social History   Tobacco Use  . Smoking status: Current Every Day Smoker    Packs/day: 0.50    Types: Cigarettes  . Smokeless tobacco: Never Used  Substance Use Topics  . Alcohol use: No  . Drug use: No    Review of Systems Constitutional: No fever/chills Eyes: No visual changes. ENT: No sore throat. Cardiovascular: Denies chest pain. Respiratory: Denies shortness of breath. Gastrointestinal: No abdominal pain.  No  nausea, no vomiting.  No diarrhea.  No constipation. Genitourinary: Negative for dysuria. Musculoskeletal: Positive for foot pain Skin: Positive for rash. Neurological: Negative for headaches, focal weakness or numbness.   ____________________________________________   PHYSICAL EXAM:  VITAL SIGNS: ED Triage Vitals  Enc Vitals Group     BP 03/09/18 0543 131/89     Pulse Rate 03/09/18 0543 71     Resp 03/09/18 0543 18     Temp 03/09/18 0543 98 F (36.7 C)     Temp Source 03/09/18 0543 Oral     SpO2 03/09/18 0543 100 %     Weight 03/09/18 0544 120 lb (54.4 kg)     Height 03/09/18 0544 5\' 3"  (1.6 m)     Head Circumference --      Peak Flow --      Pain Score 03/09/18 0543 7     Pain Loc --      Pain Edu? --      Excl. in GC? --     Constitutional: Alert and oriented x4 appears exquisitely uncomfortable although nontoxic no diaphoresis Eyes: PERRL EOMI. Head: Atraumatic. Nose: No congestion/rhinnorhea. Mouth/Throat: No trismus Neck: No stridor.   Cardiovascular: Normal rate, regular rhythm. Grossly normal heart sounds.  Good peripheral circulation. Respiratory: Normal respiratory effort.  No retractions. Lungs CTAB and moving good air Gastrointestinal: Soft nontender Musculoskeletal: Dorsal aspect of left foot with roughly 13 cm of erythema and swelling.  No bulla blisters or sloughing.  No distinct Parke Poisson marks noted Neurologic:  Normal speech and language. No gross focal neurologic deficits are appreciated. Skin: Rash as above Psychiatric: Mood and affect are normal. Speech and behavior are normal.    ____________________________________________   DIFFERENTIAL includes but not limited to  Hymenoptera envenomation, lacrodectisim, crotalid envenomation ____________________________________________   LABS (all labs ordered are listed, but only abnormal results are displayed)  Labs Reviewed  COMPREHENSIVE METABOLIC PANEL - Abnormal; Notable for the following  components:      Result Value   Potassium 3.4 (*)    All other components within normal limits  CBC WITH DIFFERENTIAL/PLATELET  FIBRINOGEN    Lab work reviewed by me with no acute disease __________________________________________  EKG   ____________________________________________  RADIOLOGY  X-ray of the left foot reviewed by me with no acute disease ____________________________________________   PROCEDURES  Procedure(s) performed: no  Procedures  Critical Care performed: no  ____________________________________________   INITIAL IMPRESSION / ASSESSMENT AND PLAN / ED COURSE  Pertinent labs & imaging results that were available during my care of the patient were reviewed by me and considered in my medical decision making (see chart for details).   As part of my medical decision making, I reviewed the following data within the electronic MEDICAL RECORD NUMBER History obtained from family if available, nursing notes, old chart and ekg, as well as notes from prior ED visits.  The patient arrives with obvious painful swelling to the dorsal aspect of the left foot.  Unclear what may have actually bit the patient but it could represent hymenoptera versus black widow versus crotalid envenomation.  We have marked the wound and elevated it.  I spoke with [facility] control who does recommend 8 hours of observation.  The patient's pain is adequately controlled following ketorolac and she declines any opioids.  ----------------------------------------- 7:50 AM on 03/09/2018 -----------------------------------------  The patient's rash is only minimally spread.  She is now noting it is somewhat itchy.  50 mg of Benadryl.  She understands she is to stay for a total of 8 hours of observation.      ____________________________________________   FINAL CLINICAL IMPRESSION(S) / ED DIAGNOSES  Final diagnoses:  Animal bite      NEW MEDICATIONS STARTED DURING THIS  VISIT:  New Prescriptions   No medications on file     Note:  This document was prepared using Dragon voice recognition software and may include unintentional dictation errors.     Merrily Brittle, MD 03/09/18 [phone]

## 2018-03-09 NOTE — ED Notes (Signed)
See paper documentation of extremity measurement

## 2018-05-01 ENCOUNTER — Other Ambulatory Visit: Payer: Self-pay | Admitting: Family Medicine

## 2018-06-12 ENCOUNTER — Emergency Department
Admission: EM | Admit: 2018-06-12 | Discharge: 2018-06-12 | Disposition: A | Discharge status: Home / Self Care | Payer: Medicaid Other | Attending: Emergency Medicine | Admitting: Emergency Medicine

## 2018-06-12 ENCOUNTER — Other Ambulatory Visit: Payer: Self-pay

## 2018-06-12 DIAGNOSIS — Z79899 Other long term (current) drug therapy: Secondary | ICD-10-CM | POA: Insufficient documentation

## 2018-06-12 DIAGNOSIS — N760 Acute vaginitis: Secondary | ICD-10-CM | POA: Insufficient documentation

## 2018-06-12 DIAGNOSIS — F17210 Nicotine dependence, cigarettes, uncomplicated: Secondary | ICD-10-CM | POA: Insufficient documentation

## 2018-06-12 DIAGNOSIS — B9689 Other specified bacterial agents as the cause of diseases classified elsewhere: Secondary | ICD-10-CM

## 2018-06-12 LAB — URINALYSIS, COMPLETE (UACMP) WITH MICROSCOPIC
Bacteria, UA: NONE SEEN
Bilirubin Urine: NEGATIVE
Glucose, UA: NEGATIVE mg/dL
Ketones, ur: NEGATIVE mg/dL
Nitrite: NEGATIVE
PROTEIN: NEGATIVE mg/dL
SPECIFIC GRAVITY, URINE: 1.003 — AB (ref 1.005–1.030)
pH: 8 (ref 5.0–8.0)

## 2018-06-12 LAB — COMPREHENSIVE METABOLIC PANEL
ALK PHOS: 49 U/L (ref 38–126)
ALT: 11 U/L (ref 0–44)
AST: 15 U/L (ref 15–41)
Albumin: 4.2 g/dL (ref 3.5–5.0)
Anion gap: 9 (ref 5–15)
BUN: 13 mg/dL (ref 6–20)
CHLORIDE: 105 mmol/L (ref 98–111)
CO2: 27 mmol/L (ref 22–32)
CREATININE: 0.84 mg/dL (ref 0.44–1.00)
Calcium: 9.7 mg/dL (ref 8.9–10.3)
GFR calc Af Amer: >60 mL/min (ref >60)
GFR calc non Af Amer: >60 mL/min (ref >60)
Glucose, Bld: 102 mg/dL — ABNORMAL HIGH (ref 70–99)
Potassium: 3.6 mmol/L (ref 3.5–5.1)
SODIUM: 141 mmol/L (ref 135–145)
Total Bilirubin: 0.6 mg/dL (ref 0.3–1.2)
Total Protein: 7.8 g/dL (ref 6.5–8.1)

## 2018-06-12 LAB — CBC
HCT: 38.9 % (ref 36.0–46.0)
Hemoglobin: 13 g/dL (ref 12.0–15.0)
MCH: 30.7 pg (ref 26.0–34.0)
MCHC: 33.4 g/dL (ref 30.0–36.0)
MCV: 92 fL (ref 80.0–100.0)
Platelets: 201 10*3/uL (ref 150–400)
RBC: 4.23 MIL/uL (ref 3.87–5.11)
RDW: 12.5 % (ref 11.5–15.5)
WBC: 6.7 10*3/uL (ref 4.0–10.5)
nRBC: 0 % (ref 0.0–0.2)

## 2018-06-12 LAB — WET PREP, GENITAL
SPERM: NONE SEEN
Trich, Wet Prep: NONE SEEN
YEAST WET PREP: NONE SEEN

## 2018-06-12 LAB — LIPASE, BLOOD: LIPASE: 27 U/L (ref 11–51)

## 2018-06-12 LAB — CHLAMYDIA/NGC RT PCR (ARMC ONLY)
Chlamydia Tr: NOT DETECTED
N gonorrhoeae: NOT DETECTED

## 2018-06-12 MED ORDER — METRONIDAZOLE 500 MG PO TABS
500.0000 mg | ORAL_TABLET | Freq: Once | ORAL | Status: AC
  Administered 2018-06-12: 500 mg via ORAL
  Filled 2018-06-12: qty 1

## 2018-06-12 MED ORDER — METRONIDAZOLE 500 MG PO TABS: 500.0000 mg | ORAL_TABLET | Freq: Two times a day (BID) | ORAL | 0 refills | Status: AC

## 2018-06-12 MED ORDER — KETOROLAC TROMETHAMINE 60 MG/2ML IM SOLN
60.0000 mg | Freq: Once | INTRAMUSCULAR | Status: AC
  Administered 2018-06-12: 60 mg via INTRAMUSCULAR
  Filled 2018-06-12: qty 2

## 2018-06-12 NOTE — ED Triage Notes (Signed)
Lower abdominal pain and urinary sx X 4 days. Denies NVD. Pt alert and oriented X4, active, cooperative, pt in NAD. RR even and unlabored, color WNL.

## 2018-06-12 NOTE — ED Notes (Signed)
Patient verbalized understanding of discharge instructions, no questions. Patient ambulated out of ED with steady gait in no distress.  

## 2018-06-12 NOTE — ED Provider Notes (Signed)
[facility] Emergency Department Provider Note  ____________________________________________  Time seen: Approximately 6:13 PM  I have reviewed the triage vital signs and the nursing notes.   HISTORY  Chief Complaint Abdominal Pain and Urinary Urgency   HPI Kelly Jacobs is a 42 y.o. female no significant past medical history who presents for evaluation of vaginal discharge.  Patient reports 4 days of pruritic vaginal discharge, white in color, moderate in intensity.  Since this morning she has had intermittent stabbing lower abdominal pain.  She tried ibuprofen at home with some relief.  She has had nausea but no vomiting.  She reports burning with urination.  She was last sexually active 2 months ago and did not use condoms.  She denies fever or chills.  No prior abdominal surgeries.  Patient is status post hysterectomy.  History reviewed. No pertinent past medical history.  There are no active problems to display for this patient.   Past Surgical History:  Procedure Laterality Date  . ABDOMINAL HYSTERECTOMY      Prior to Admission medications   Medication Sig Start Date End Date Taking? Authorizing Provider  doxycycline (VIBRAMYCIN) 100 MG capsule Take 1 capsule (100 mg total) by mouth 2 (two) times daily. 03/09/18   Emily Filbert, MD  HYDROcodone-acetaminophen (NORCO/VICODIN) 5-325 MG tablet Take 1 tablet by mouth 2 (two) times daily as needed for moderate pain. 03/09/18   Emily Filbert, MD  metroNIDAZOLE (FLAGYL) 500 MG tablet Take 1 tablet (500 mg total) by mouth 2 (two) times daily for 7 days. 06/12/18 06/19/18  Nita Sickle, MD  predniSONE (STERAPRED UNI-PAK 21 TAB) 10 MG (21) TBPK tablet Take by mouth daily. Dispense steroid taper pack as directed 03/09/18   Emily Filbert, MD    Allergies Flexeril [cyclobenzaprine]; Macrobid [nitrofurantoin]; Percocet [oxycodone-acetaminophen]; and Ceftriaxone  No family history on  file.  Social History Social History   Tobacco Use  . Smoking status: Current Every Day Smoker    Packs/day: 0.50    Types: Cigarettes  . Smokeless tobacco: Never Used  Substance Use Topics  . Alcohol use: No  . Drug use: No    Review of Systems  Constitutional: Negative for fever. Eyes: Negative for visual changes. ENT: Negative for sore throat. Neck: No neck pain  Cardiovascular: Negative for chest pain. Respiratory: Negative for shortness of breath. Gastrointestinal: + Lower abdominal pain and nausea. No vomiting or diarrhea. Genitourinary: + dysuria and vaginal discharge Musculoskeletal: Negative for back pain. Skin: Negative for rash. Neurological: Negative for headaches, weakness or numbness. Psych: No SI or HI  ____________________________________________   PHYSICAL EXAM:  VITAL SIGNS: ED Triage Vitals [06/12/18 1643]  Enc Vitals Group     BP (!) 115/49     Pulse Rate 77     Resp 18     Temp 98.3 F (36.8 C)     Temp Source Oral     SpO2 100 %     Weight 114 lb (51.7 kg)     Height 5\' 3"  (1.6 m)     Head Circumference      Peak Flow      Pain Score 6     Pain Loc      Pain Edu?      Excl. in GC?     Constitutional: Alert and oriented. Well appearing and in no apparent distress. HEENT:      Head: Normocephalic and atraumatic.         Eyes: Conjunctivae  are normal. Sclera is non-icteric.       Mouth/Throat: Mucous membranes are moist.       Neck: Supple with no signs of meningismus. Cardiovascular: Regular rate and rhythm. No murmurs, gallops, or rubs. 2+ symmetrical distal pulses are present in all extremities. No JVD. Respiratory: Normal respiratory effort. Lungs are clear to auscultation bilaterally. No wheezes, crackles, or rhonchi.  Gastrointestinal: Soft, tender to palpation in the suprapubic region, and non distended with positive bowel sounds. No rebound or guarding. Pelvic exam: Normal external genitalia, no rashes or lesions. Normal  cervical mucus. Os closed. No adnexal tenderness.   Musculoskeletal: Nontender with normal range of motion in all extremities. No edema, cyanosis, or erythema of extremities. Neurologic: Normal speech and language. Face is symmetric. Moving all extremities. No gross focal neurologic deficits are appreciated. Skin: Skin is warm, dry and intact. No rash noted. Psychiatric: Mood and affect are normal. Speech and behavior are normal.  ____________________________________________   LABS (all labs ordered are listed, but only abnormal results are displayed)  Labs Reviewed  WET PREP, GENITAL - Abnormal; Notable for the following components:      Result Value   Clue Cells Wet Prep HPF POC PRESENT (*)    WBC, Wet Prep HPF POC FEW (*)    All other components within normal limits  COMPREHENSIVE METABOLIC PANEL - Abnormal; Notable for the following components:   Glucose, Bld 102 (*)    All other components within normal limits  URINALYSIS, COMPLETE (UACMP) WITH MICROSCOPIC - Abnormal; Notable for the following components:   Color, Urine STRAW (*)    APPearance CLEAR (*)    Specific Gravity, Urine 1.003 (*)    Hgb urine dipstick MODERATE (*)    Leukocytes, UA TRACE (*)    All other components within normal limits  CHLAMYDIA/NGC RT PCR ([facility] ONLY)  URINE CULTURE  LIPASE, BLOOD  CBC   ____________________________________________  EKG  none  ____________________________________________  RADIOLOGY  none  ____________________________________________   PROCEDURES  Procedure(s) performed: None Procedures Critical Care performed:  None ____________________________________________   INITIAL IMPRESSION / ASSESSMENT AND PLAN / ED COURSE   42 y.o. female no significant past medical history who presents for evaluation of vaginal discharge for 4 days and now lower abdominal pain. Ddx STD, TOA, UTI.  Patient is status post hysterectomy however still has her cervix.  Pelvic exam  showing mild vaginal discharge with no adnexal tenderness.  Wet prep was positive for BV.  GC and Chlamydia are pending.  Patient will be started on Flagyl for symptomatic BV.  With no adnexal tenderness and normal labs do not feel patient warrants a ultrasound as I do not have a clinical suspicion at this time of TOA.  However did discuss standard return precautions with patient.  _________________________ 8:20 PM on 06/12/2018 -----------------------------------------  Gonorrhea and Chlamydia negative.  Patient be discharged home on Flagyl   As part of my medical decision making, I reviewed the following data within the electronic MEDICAL RECORD NUMBER Nursing notes reviewed and incorporated, Labs reviewed , Old chart reviewed, Notes from prior ED visits and [place] Controlled Substance Database    Pertinent labs & imaging results that were available during my care of the patient were reviewed by me and considered in my medical decision making (see chart for details).    ____________________________________________   FINAL CLINICAL IMPRESSION(S) / ED DIAGNOSES  Final diagnoses:  BV (bacterial vaginosis)      NEW MEDICATIONS STARTED DURING THIS VISIT:  ED Discharge Orders         Ordered    metroNIDAZOLE (FLAGYL) 500 MG tablet  2 times daily     06/12/18 2020           Note:  This document was prepared using Dragon voice recognition software and may include unintentional dictation errors.    Nita SickleVeronese, [place], MD 06/12/18 2021

## 2018-06-14 LAB — URINE CULTURE: Culture: 50000 — AB

## 2019-02-08 ENCOUNTER — Emergency Department
Admission: EM | Admit: 2019-02-08 | Discharge: 2019-02-08 | Disposition: A | Discharge status: Home / Self Care | Payer: Medicaid Other | Attending: Emergency Medicine | Admitting: Emergency Medicine

## 2019-02-08 ENCOUNTER — Emergency Department: Payer: Medicaid Other

## 2019-02-08 ENCOUNTER — Encounter: Payer: Self-pay | Admitting: Emergency Medicine

## 2019-02-08 ENCOUNTER — Other Ambulatory Visit: Payer: Self-pay

## 2019-02-08 DIAGNOSIS — Z5321 Procedure and treatment not carried out due to patient leaving prior to being seen by health care provider: Secondary | ICD-10-CM | POA: Insufficient documentation

## 2019-02-08 DIAGNOSIS — R079 Chest pain, unspecified: Secondary | ICD-10-CM | POA: Insufficient documentation

## 2019-02-08 LAB — CBC
HCT: 37 % (ref 36.0–46.0)
Hemoglobin: 12.4 g/dL (ref 12.0–15.0)
MCH: 31.1 pg (ref 26.0–34.0)
MCHC: 33.5 g/dL (ref 30.0–36.0)
MCV: 92.7 fL (ref 80.0–100.0)
Platelets: 184 10*3/uL (ref 150–400)
RBC: 3.99 MIL/uL (ref 3.87–5.11)
RDW: 12.2 % (ref 11.5–15.5)
WBC: 6.2 10*3/uL (ref 4.0–10.5)
nRBC: 0 % (ref 0.0–0.2)

## 2019-02-08 LAB — BASIC METABOLIC PANEL
Anion gap: 9 (ref 5–15)
BUN: 9 mg/dL (ref 6–20)
CO2: 22 mmol/L (ref 22–32)
Calcium: 9.1 mg/dL (ref 8.9–10.3)
Chloride: 110 mmol/L (ref 98–111)
Creatinine, Ser: 0.87 mg/dL (ref 0.44–1.00)
GFR calc Af Amer: >60 mL/min (ref >60)
GFR calc non Af Amer: >60 mL/min (ref >60)
Glucose, Bld: 90 mg/dL (ref 70–99)
Potassium: 3.3 mmol/L — ABNORMAL LOW (ref 3.5–5.1)
Sodium: 141 mmol/L (ref 135–145)

## 2019-02-08 LAB — TROPONIN I (HIGH SENSITIVITY): Troponin I (High Sensitivity): 3 ng/L (ref <18)

## 2019-02-08 NOTE — ED Notes (Signed)
First Nurse Note: Pt to ED via [facility] from home for chest pain that started last night. VS WNL, 12- lead WNL.  Pt given 1 Nitro and 324 mg of Aspirin.  Pt has 20 G in her [place]

## 2019-02-08 NOTE — ED Notes (Signed)
DC iV, pt wanted to leave instead of waiting.

## 2019-02-08 NOTE — ED Triage Notes (Addendum)
Pt woke out of sleep with severe left jaw pain that then radiated into chest and left arm.  Chest is feeling better after NTG from EMS but still having a nagging/achy pain in left arm. Hassan Rowan. VSS.  No cardiac hx that is known. Mom had 4 MI last year.. No fever/cough.

## 2019-02-08 NOTE — ED Notes (Signed)
Pt called out from subwait stating that her chest pain has gotten worse. RN reviewed pts labs, order was put in for repeat EKG. Melody EDT called to get repeat EKG.

## 2019-02-12 ENCOUNTER — Telehealth: Payer: Self-pay | Admitting: Emergency Medicine

## 2019-02-12 NOTE — Telephone Encounter (Signed)
Called patient due to lwot to inquire about condition and follow up plans. Left message.   

## 2019-03-18 ENCOUNTER — Encounter: Payer: Self-pay | Admitting: Emergency Medicine

## 2019-03-18 ENCOUNTER — Emergency Department
Admission: EM | Admit: 2019-03-18 | Discharge: 2019-03-18 | Disposition: A | Discharge status: Home / Self Care | Payer: Medicaid Other | Attending: Emergency Medicine | Admitting: Emergency Medicine

## 2019-03-18 ENCOUNTER — Other Ambulatory Visit: Payer: Self-pay

## 2019-03-18 DIAGNOSIS — K0889 Other specified disorders of teeth and supporting structures: Secondary | ICD-10-CM | POA: Insufficient documentation

## 2019-03-18 DIAGNOSIS — R51 Headache: Secondary | ICD-10-CM | POA: Insufficient documentation

## 2019-03-18 DIAGNOSIS — F17210 Nicotine dependence, cigarettes, uncomplicated: Secondary | ICD-10-CM | POA: Insufficient documentation

## 2019-03-18 MED ORDER — IBUPROFEN 600 MG PO TABS: 600.0000 mg | ORAL_TABLET | Freq: Three times a day (TID) | ORAL | 0 refills | Status: DC | PRN

## 2019-03-18 MED ORDER — CLINDAMYCIN HCL 150 MG PO CAPS: ORAL_CAPSULE | ORAL | 0 refills | Status: DC

## 2019-03-18 NOTE — ED Notes (Signed)
See triage note  Presents with frontal headache for couple of days   States she thinks it is her front teeth    Pain is moving into her head  Positive n/v   Last time vomited was yesterday

## 2019-03-18 NOTE — ED Triage Notes (Signed)
Patient presents to the ED with headache x 4 days.  Patient states she feels like the headache started out with a toothache to her left upper tooth, patient reports a known abscess to that tooth and states that pain has gotten severe and headache has increased in severity as well.  Patient reports vomiting 1 time, two days ago due to pain.  Patient is holding her head in triage.

## 2019-03-18 NOTE — ED Provider Notes (Signed)
[facility] Emergency Department Provider Note   ____________________________________________   First MD Initiated Contact with Patient 03/18/19 [phone]     (approximate)  I have reviewed the triage vital signs and the nursing notes.   HISTORY  Chief Complaint Dental Pain   HPI Kelly Jacobs is a 43 y.o. female presents to the ED with complaint of left upper dental pain that is been going on off and on for approximately 1 year.  Patient states that she was given an antibiotic that was to take care of both a urinary tract infection and her dental pain.  This was approximately 2 weeks ago.  With her dental pain she is developed a headache for the last 4 days.  She denies any photophobia at this time.  There is been some nausea with vomiting but no vomiting today.  Patient has a history of "migraine headaches".  She rates her pain as a 10/10.       History reviewed. No pertinent past medical history.  There are no active problems to display for this patient.   Past Surgical History:  Procedure Laterality Date  . ABDOMINAL HYSTERECTOMY      Prior to Admission medications   Medication Sig Start Date End Date Taking? Authorizing Provider  clindamycin (CLEOCIN) 150 MG capsule Take 2 tabs tid until finished 03/18/19   Letitia Neri L, PA-C  ibuprofen (ADVIL) 600 MG tablet Take 1 tablet (600 mg total) by mouth every 8 (eight) hours as needed. 03/18/19   Johnn Hai, PA-C    Allergies Flexeril [cyclobenzaprine], Macrobid [nitrofurantoin], Percocet [oxycodone-acetaminophen], and Ceftriaxone  No family history on file.  Social History Social History   Tobacco Use  . Smoking status: Current Every Day Smoker    Packs/day: 0.50    Types: Cigarettes  . Smokeless tobacco: Never Used  Substance Use Topics  . Alcohol use: No  . Drug use: No    Review of Systems Constitutional: No fever/chills Eyes: No visual changes. ENT: No sore throat.  Positive  dental pain. Cardiovascular: Denies chest pain. Respiratory: Denies shortness of breath. Gastrointestinal:  No nausea, no vomiting.  Musculoskeletal: Negative for muscle aches. Skin: Negative for rash. Neurological: Positive for headaches, no focal weakness or numbness. ____________________________________________   PHYSICAL EXAM:  VITAL SIGNS: ED Triage Vitals  Enc Vitals Group     BP 03/18/19 0932 113/73     Pulse Rate 03/18/19 0932 69     Resp --      Temp 03/18/19 0932 97.7 F (36.5 C)     Temp Source 03/18/19 0932 Oral     SpO2 03/18/19 0932 100 %     Weight 03/18/19 0932 113 lb (51.3 kg)     Height 03/18/19 0932 5\' 3"  (1.6 m)     Head Circumference --      Peak Flow --      Pain Score 03/18/19 0937 10     Pain Loc --      Pain Edu? --      Excl. in [place]? --    Constitutional: Alert and oriented. Well appearing and in no acute distress.  No photophobia, patient is able to talk in complete sentences and answers questions appropriately. Eyes: Conjunctivae are normal.  Head: Atraumatic.  Nose: No congestion/rhinnorhea. Mouth/Throat: Mucous membranes are moist.  Oropharynx non-erythematous.  Left upper premolars are tender with mild erythema and edema to the gums.  No active abscess or drainage was noted. Neck: No stridor.  Hematological/Lymphatic/Immunilogical: No cervical lymphadenopathy. Cardiovascular: Normal rate, regular rhythm. Grossly normal heart sounds.  Good peripheral circulation. Respiratory: Normal respiratory effort.  No retractions. Lungs CTAB. Musculoskeletal: Moves upper and lower extremities without any difficulty.  Normal gait was noted. Neurologic:  Normal speech and language. No gross focal neurologic deficits are appreciated. No gait instability. Skin:  Skin is warm, dry and intact. No rash noted. Psychiatric: Mood and affect are normal. Speech and behavior are normal.  ____________________________________________   LABS (all labs ordered are  listed, but only abnormal results are displayed)  Labs Reviewed - No data to display   PROCEDURES  Procedure(s) performed (including Critical Care):  Procedures   ____________________________________________   INITIAL IMPRESSION / ASSESSMENT AND PLAN / ED COURSE  As part of my medical decision making, I reviewed the following data within the electronic MEDICAL RECORD NUMBER Notes from prior ED visits and [place] Controlled Substance Database  43 year old female presents to the ED with complaint of dental pain off and known for approximately 1 year but worse in the last 4 days.  Patient states that she also has a headache and has a history of headaches but she states that the tooth ache may have caused the headache.  On exam patient does not have any photophobia or is in acute distress.  She is talking in complete sentences and on exam she does have dental pain on palpation.  Patient was given a prescription for clindamycin and ibuprofen.  She was also given list of dental clinics including the walk-in clinic at [facility].  ____________________________________________   FINAL CLINICAL IMPRESSION(S) / ED DIAGNOSES  Final diagnoses:  Pain, dental     ED Discharge Orders         Ordered    clindamycin (CLEOCIN) 150 MG capsule     03/18/19 1039    ibuprofen (ADVIL) 600 MG tablet  Every 8 hours PRN,   Status:  Discontinued     03/18/19 1039    ibuprofen (ADVIL) 600 MG tablet  Every 8 hours PRN     03/18/19 1040           Note:  This document was prepared using Dragon voice recognition software and may include unintentional dictation errors.    Tommi RumpsSummers, Jasminemarie L, PA-C 03/18/19 1233    Sharyn CreamerQuale, Mark, MD 03/18/19 [phone]

## 2019-03-18 NOTE — Discharge Instructions (Addendum)
With your primary care provider if any continued problems with your headache.  At this time a prescription for antibiotics was sent to your pharmacy which is much stronger than what you took.  Ibuprofen as needed for pain.  Increase fluids.  Call 1 of the dental clinics listed on your discharge papers.  Also the information about the walk-in clinic at [facility] was listed on your discharge papers.  OPTIONS FOR DENTAL FOLLOW UP CARE  [place] [facility] and [place] [email or web address] [phone])  [facility] [phone])  [place] [phone])  [place] [phone])  [facility] [phone]) This clinic caters to the indigent population and is on a lottery system. Location: [employer] [facility], [place], [place], [place] Clinic Hours: Wednesdays from 6pm - 9pm, patients seen by a lottery system. For dates, call or go to [email or web address] Services: Cleanings, fillings and simple extractions. Payment Options: DENTAL WORK IS FREE OF CHARGE. Bring proof of income or support. Best way to get seen: Arrive at 5:15 pm - this is a lottery, NOT first come/first serve, so arriving earlier will not increase your chances of being seen.     [facility] [phone] Select option 1 for emergencies   Location: [facility], [place], [address] [place] [facility] Hours: No walk-ins accepted - call the day before to schedule an appointment. Check in times are 9:30 am and 1:30 pm. Services: Simple extractions, temporary fillings, pulpectomy/pulp debridement, uncomplicated abscess drainage. Payment Options: PAYMENT IS DUE AT THE TIME OF SERVICE.  Fee is usually $100-200, additional surgical procedures (e.g.  abscess drainage) may be extra. Cash, checks, Visa/MasterCard accepted.  Can file Medicaid if patient is covered for dental - patient should call case worker to check. No discount for [facility] patients. Best way to get seen: MUST call the day before and get onto the schedule. Can usually be seen the next 1-2 days. No walk-ins accepted.     [place] [phone]   Location: [place], [place] [facility] Hours: M, W, Th, F 8am or 1:30pm, Tues 9a or 1:30 - first come/first served. Services: Simple extractions, temporary fillings, uncomplicated abscess drainage.  You do not need to be an [facility] resident. Payment Options: PAYMENT IS DUE AT THE TIME OF SERVICE. Dental insurance, otherwise sliding scale - bring proof of income or support. Depending on income and treatment needed, cost is usually $50-200. Best way to get seen: Arrive early as it is first come/first served.     [facility] [phone]   Location: [place] [facility] Hours: Mon-Thu 8a-5p Services: Most basic dental services including extractions and fillings. Payment Options: PAYMENT IS DUE AT THE TIME OF SERVICE. Sliding scale, up to 50% off - bring proof if income or support. Medicaid with dental option accepted. Best way to get seen: Call to schedule an appointment, can usually be seen within 2 weeks OR they will try to see walk-ins - show up at [place] or 2p (you may have to wait).     [facility] [place] RESIDENTS ONLY   Location: [facility] [place] [address] [place], [place] [address] Clinic Hours: By appointment only. Monday - Thursday 8am-5pm, Friday 8am-12pm Services: Cleanings, fillings, extractions. Payment Options: PAYMENT IS DUE AT  THE TIME OF SERVICE. Cash, Visa or MasterCard. Sliding scale - $30 minimum per service. Best way to get  seen: Come in to office, complete packet and make an appointment - need proof of income or support monies for each household member and proof of [place] residence. Usually takes about a month to get in.     [facility] [phone]   Location: [address] [facility] Hours: Walk-in Urgent Care Dental Services are offered Monday-Friday mornings only. The numbers of emergencies accepted daily is limited to the number of providers available. Maximum 15 - Mondays, Wednesdays & Thursdays Maximum 10 - Tuesdays & Fridays Services: You do not need to be a [place] resident to be seen for a dental emergency. Emergencies are defined as pain, swelling, abnormal bleeding, or dental trauma. Walkins will receive x-rays if needed. NOTE: Dental cleaning is not an emergency. Payment Options: PAYMENT IS DUE AT THE TIME OF SERVICE. Minimum co-pay is $40.00 for uninsured patients. Minimum co-pay is $3.00 for Medicaid with dental coverage. Dental Insurance is accepted and must be presented at time of visit. Medicare does not cover dental. Forms of payment: Cash, credit card, checks. Best way to get seen: If not previously registered with the clinic, walk-in dental registration begins at 7:15 am and is on a first come/first serve basis. If previously registered with the clinic, call to make an appointment.     [facility] [phone] [place] RESIDENTS ONLY   Location: [address], [place], [place] Clinic Hours: Mon-Thu 10a-2p Services: Extractions only! Payment Options: FREE (donations accepted) - bring proof of income or support Best way to get seen: Call and schedule an appointment OR come at 8am on the 1st Monday of every month (except for holidays) when it is first come/first served.     [facility] [phone]   [address]: [address], [place] [facility] Hours: Friday mornings Services, Payment Options, Best  way to get seen: Call for info

## 2019-06-20 ENCOUNTER — Other Ambulatory Visit: Payer: Self-pay

## 2019-06-20 ENCOUNTER — Encounter: Payer: Self-pay | Admitting: Intensive Care

## 2019-06-20 ENCOUNTER — Emergency Department
Admission: EM | Admit: 2019-06-20 | Discharge: 2019-06-20 | Disposition: A | Discharge status: Home / Self Care | Payer: Medicaid Other | Attending: Student | Admitting: Student

## 2019-06-20 DIAGNOSIS — R110 Nausea: Secondary | ICD-10-CM | POA: Insufficient documentation

## 2019-06-20 DIAGNOSIS — R1084 Generalized abdominal pain: Secondary | ICD-10-CM

## 2019-06-20 DIAGNOSIS — F17210 Nicotine dependence, cigarettes, uncomplicated: Secondary | ICD-10-CM | POA: Insufficient documentation

## 2019-06-20 DIAGNOSIS — Z20828 Contact with and (suspected) exposure to other viral communicable diseases: Secondary | ICD-10-CM | POA: Insufficient documentation

## 2019-06-20 LAB — CBC
HCT: 39.7 % (ref 36.0–46.0)
Hemoglobin: 13.2 g/dL (ref 12.0–15.0)
MCH: 30.6 pg (ref 26.0–34.0)
MCHC: 33.2 g/dL (ref 30.0–36.0)
MCV: 92.1 fL (ref 80.0–100.0)
Platelets: 179 10*3/uL (ref 150–400)
RBC: 4.31 MIL/uL (ref 3.87–5.11)
RDW: 13.1 % (ref 11.5–15.5)
WBC: 8.6 10*3/uL (ref 4.0–10.5)
nRBC: 0 % (ref 0.0–0.2)

## 2019-06-20 LAB — URINALYSIS, COMPLETE (UACMP) WITH MICROSCOPIC
Bacteria, UA: NONE SEEN
Bilirubin Urine: NEGATIVE
Glucose, UA: NEGATIVE mg/dL
Ketones, ur: NEGATIVE mg/dL
Leukocytes,Ua: NEGATIVE
Nitrite: NEGATIVE
Protein, ur: NEGATIVE mg/dL
Specific Gravity, Urine: 1.004 — ABNORMAL LOW (ref 1.005–1.030)
pH: 6 (ref 5.0–8.0)

## 2019-06-20 LAB — LIPASE, BLOOD: Lipase: 20 U/L (ref 11–51)

## 2019-06-20 LAB — COMPREHENSIVE METABOLIC PANEL
ALT: 8 U/L (ref 0–44)
AST: 13 U/L — ABNORMAL LOW (ref 15–41)
Albumin: 4 g/dL (ref 3.5–5.0)
Alkaline Phosphatase: 48 U/L (ref 38–126)
Anion gap: 9 (ref 5–15)
BUN: 10 mg/dL (ref 6–20)
CO2: 25 mmol/L (ref 22–32)
Calcium: 9 mg/dL (ref 8.9–10.3)
Chloride: 105 mmol/L (ref 98–111)
Creatinine, Ser: 0.87 mg/dL (ref 0.44–1.00)
GFR calc Af Amer: >60 mL/min (ref >60)
GFR calc non Af Amer: >60 mL/min (ref >60)
Glucose, Bld: 92 mg/dL (ref 70–99)
Potassium: 3.6 mmol/L (ref 3.5–5.1)
Sodium: 139 mmol/L (ref 135–145)
Total Bilirubin: 0.7 mg/dL (ref 0.3–1.2)
Total Protein: 7.7 g/dL (ref 6.5–8.1)

## 2019-06-20 LAB — URINE DRUG SCREEN, QUALITATIVE (ARMC ONLY)
Amphetamines, Ur Screen: NOT DETECTED
Barbiturates, Ur Screen: NOT DETECTED
Benzodiazepine, Ur Scrn: NOT DETECTED
Cannabinoid 50 Ng, Ur ~~LOC~~: NOT DETECTED
Cocaine Metabolite,Ur ~~LOC~~: NOT DETECTED
MDMA (Ecstasy)Ur Screen: NOT DETECTED
Methadone Scn, Ur: NOT DETECTED
Opiate, Ur Screen: NOT DETECTED
Phencyclidine (PCP) Ur S: NOT DETECTED
Tricyclic, Ur Screen: NOT DETECTED

## 2019-06-20 LAB — PREGNANCY, URINE: Preg Test, Ur: NEGATIVE

## 2019-06-20 MED ORDER — DICYCLOMINE HCL 10 MG PO CAPS
20.0000 mg | ORAL_CAPSULE | Freq: Once | ORAL | Status: AC
  Administered 2019-06-20: 20 mg via ORAL
  Filled 2019-06-20: qty 2

## 2019-06-20 MED ORDER — ONDANSETRON HCL 4 MG PO TABS: 4.0000 mg | ORAL_TABLET | Freq: Three times a day (TID) | ORAL | 0 refills | Status: AC | PRN

## 2019-06-20 MED ORDER — KETOROLAC TROMETHAMINE 30 MG/ML IJ SOLN
30.0000 mg | Freq: Once | INTRAMUSCULAR | Status: AC
  Administered 2019-06-20: 30 mg via INTRAMUSCULAR
  Filled 2019-06-20: qty 1

## 2019-06-20 MED ORDER — ONDANSETRON 4 MG PO TBDP
4.0000 mg | ORAL_TABLET | Freq: Once | ORAL | Status: AC
  Administered 2019-06-20: 11:00:00 4 mg via ORAL
  Filled 2019-06-20: qty 1

## 2019-06-20 MED ORDER — DICYCLOMINE HCL 20 MG PO TABS: 20.0000 mg | ORAL_TABLET | Freq: Four times a day (QID) | ORAL | 0 refills | Status: DC | PRN

## 2019-06-20 NOTE — ED Notes (Signed)
See triage note  Presents with generalized abd pain for couple of days  States pain is sharp and continuous  Positive nausea  No vomiting

## 2019-06-20 NOTE — ED Provider Notes (Signed)
[facility] Emergency Department Provider Note  ____________________________________________   First MD Initiated Contact with Patient 06/20/19 1004     (approximate)  I have reviewed the triage vital signs and the nursing notes.  History  Chief Complaint Abdominal Pain    HPI Kelly Jacobs is a 43 y.o. female with no significant medical history who presents for abdominal pain. Patient reports generalized abdominal pain since Tuesday.  She describes it as cramping/sharp. 8/10 in severity.  Diffuse, without any focal or localizing component. Associated with nausea, but no vomiting. No radiation, no alleviating or aggravating factors. No fever, diarrhea, dysuria, or vaginal discharge. She does report a few family members who are sick and recently tested for COVID (daughter's test just came back negative this morning). She denies any heavy alcohol use. Denies drug use.    Past Medical Hx History reviewed. No pertinent past medical history.  Problem List There are no problems to display for this patient.   Past Surgical Hx Past Surgical History:  Procedure Laterality Date  . ABDOMINAL HYSTERECTOMY      Medications Prior to Admission medications   Not on File    Allergies Flexeril [cyclobenzaprine], Macrobid [nitrofurantoin], Percocet [oxycodone-acetaminophen], and Ceftriaxone  Family Hx History reviewed. No pertinent family history.  Social Hx Social History   Tobacco Use  . Smoking status: Current Every Day Smoker    Packs/day: 0.50    Types: Cigarettes  . Smokeless tobacco: Never Used  Substance Use Topics  . Alcohol use: Yes  . Drug use: No     Review of Systems  Constitutional: Negative for fever, chills. Eyes: Negative for visual changes. ENT: Negative for sore throat. Cardiovascular: Negative for chest pain. Respiratory: Negative for shortness of breath. Gastrointestinal: + abdominal cramping, nausea  Genitourinary:  Negative for dysuria. Musculoskeletal: Negative for leg swelling. Skin: Negative for rash. Neurological: Negative for for headaches.   Physical Exam  Vital Signs: ED Triage Vitals [06/20/19 0827]  Enc Vitals Group     BP 123/84     Pulse Rate 84     Resp 16     Temp 98.9 F (37.2 C)     Temp Source Oral     SpO2 100 %     Weight 110 lb (49.9 kg)     Height 5\' 3"  (1.6 m)     Head Circumference      Peak Flow      Pain Score 8     Pain Loc      Pain Edu?      Excl. in [place]?     Constitutional: Alert and oriented.  Head: Normocephalic. Atraumatic. Eyes: Conjunctivae clear. Sclera anicteric. Nose: No congestion. No rhinorrhea. Mouth/Throat: Wearing mask.  Neck: No stridor.   Cardiovascular: Normal rate, regular rhythm. Extremities well perfused. Respiratory: Normal respiratory effort.  Lungs CTAB. Gastrointestinal: Soft. Non-tender throughout to deep palpation. Non-distended.  Musculoskeletal: No lower extremity edema. No deformities. Neurologic:  Normal speech and language. No gross focal neurologic deficits are appreciated.  Skin: Skin is warm, dry and intact. No rash noted. Psychiatric: Mood and affect are appropriate for situation.  EKG  Personally reviewed.   Rate: 76 Rhythm: NSR Axis: normal Intervals: WNL No acute ischemic changes No STEMI    Radiology  N/A   Procedures  Procedure(s) performed (including critical care):  Procedures   Initial Impression / Assessment and Plan / ED Course  43 y.o. female who presents to the ED for generalized abdominal cramping,  as above.   Ddx: GI viral process, UTI, pancreatitis, pregnancy. Low suspicion for acute intra-abdominal infection such as appendicitis at this time given no fever, no leukocytosis, tolerated PO in the ED, reassuring abdominal exam.  Labs without actionable derangements.  No leukocytosis.  No evidence of urine infection.  Normal bilirubin and LFTs.  Normal lipase.  Negative pregnancy  test.  She reports improvement in her symptoms after medications, and down to 3/10 in severity.  We will send out and COVID swab given her sick contacts, she understands results will take 1 to 2 days, and the need for social distancing/quarantining while awaiting.  Will give Rx for Bentyl, Zofran to continue with supportive care.  Discussed strict return precautions.  Patient voices understanding is comfortable to plan and discharge.   Final Clinical Impression(s) / ED Diagnosis  Final diagnoses:  Generalized abdominal cramping       Note:  This document was prepared using Dragon voice recognition software and may include unintentional dictation errors.   Lilia Pro., MD 06/20/19 1120

## 2019-06-20 NOTE — Discharge Instructions (Addendum)
Thank you for letting us take care of you in the emergency department.   At this time, your coronavirus swab results are pending. You should hear your results in 1-3 days if they are positive.   In the meantime, it is important to take precautions in case you are positive. This includes quarantining, wearing a mask, and social distancing.   Continue to take over the counter acetaminophen and ibuprofen as directed on the box to help with fevers as well as aches and pains.   Please return to the ER for any new or worsening symptoms, such as difficulty breathing, fever, worsening pain, vomiting and diarrhea, or chest pain.

## 2019-06-20 NOTE — ED Triage Notes (Signed)
Patient c/o abd pain with nausea since Tuesday. C/o severe cramps. Denies V/D. Denies urinary symptoms

## 2019-06-21 LAB — NOVEL CORONAVIRUS, NAA (HOSP ORDER, SEND-OUT TO REF LAB; TAT 18-24 HRS): SARS-CoV-2, NAA: NOT DETECTED

## 2019-06-22 ENCOUNTER — Encounter: Payer: Self-pay | Admitting: Emergency Medicine

## 2019-06-22 ENCOUNTER — Other Ambulatory Visit: Payer: Self-pay

## 2019-06-22 ENCOUNTER — Telehealth: Payer: Self-pay

## 2019-06-22 DIAGNOSIS — F17210 Nicotine dependence, cigarettes, uncomplicated: Secondary | ICD-10-CM | POA: Insufficient documentation

## 2019-06-22 DIAGNOSIS — R1032 Left lower quadrant pain: Secondary | ICD-10-CM | POA: Insufficient documentation

## 2019-06-22 LAB — CBC
HCT: 40.8 % (ref 36.0–46.0)
Hemoglobin: 13.6 g/dL (ref 12.0–15.0)
MCH: 30.6 pg (ref 26.0–34.0)
MCHC: 33.3 g/dL (ref 30.0–36.0)
MCV: 91.7 fL (ref 80.0–100.0)
Platelets: 201 10*3/uL (ref 150–400)
RBC: 4.45 MIL/uL (ref 3.87–5.11)
RDW: 12.8 % (ref 11.5–15.5)
WBC: 6.4 10*3/uL (ref 4.0–10.5)
nRBC: 0 % (ref 0.0–0.2)

## 2019-06-22 LAB — URINALYSIS, COMPLETE (UACMP) WITH MICROSCOPIC
Bilirubin Urine: NEGATIVE
Glucose, UA: NEGATIVE mg/dL
Ketones, ur: NEGATIVE mg/dL
Leukocytes,Ua: NEGATIVE
Nitrite: NEGATIVE
Protein, ur: NEGATIVE mg/dL
Specific Gravity, Urine: 1.003 — ABNORMAL LOW (ref 1.005–1.030)
WBC, UA: NONE SEEN WBC/hpf (ref 0–5)
pH: 7 (ref 5.0–8.0)

## 2019-06-22 MED ORDER — SODIUM CHLORIDE 0.9% FLUSH: 3.0000 mL | Freq: Once | INTRAVENOUS | Status: DC

## 2019-06-22 NOTE — ED Notes (Signed)
Warm blanket provided, pt placed in lobby.

## 2019-06-22 NOTE — Telephone Encounter (Signed)

## 2019-06-22 NOTE — ED Notes (Addendum)
Patient to waiting room via wheelchair by EMS.  Per EMS patient with complaint of abdominal pain and was seen on 12/10 at this ED for same. EMS vital signs:  P 60, BP 164/73, pulse oxi 100% on room air, 12-lead within normal limits.

## 2019-06-22 NOTE — ED Notes (Signed)
While in triage, pt states "I have to pee again." Pt had just peed about 10 minutes prior to triage start. Denies any burning with urination or blood in urine.

## 2019-06-22 NOTE — ED Triage Notes (Signed)
Pt here with c/o "severe" 10/10 lower abd pain that began on Tuesday, was seen here then for the same, tearful in triage, states bloating with no abnormalities to bowel movements, denies nausea, states left side hurts more than the right, possibly feels a "knot" over her left lower abd, eating makes pain worse.

## 2019-06-23 ENCOUNTER — Encounter: Payer: Self-pay | Admitting: Radiology

## 2019-06-23 ENCOUNTER — Emergency Department
Admission: EM | Admit: 2019-06-23 | Discharge: 2019-06-23 | Disposition: A | Discharge status: Home / Self Care | Payer: Medicaid Other | Attending: Emergency Medicine | Admitting: Emergency Medicine

## 2019-06-23 ENCOUNTER — Emergency Department: Payer: Medicaid Other

## 2019-06-23 DIAGNOSIS — R1032 Left lower quadrant pain: Secondary | ICD-10-CM

## 2019-06-23 LAB — COMPREHENSIVE METABOLIC PANEL
ALT: 14 U/L (ref 0–44)
AST: 17 U/L (ref 15–41)
Albumin: 4.3 g/dL (ref 3.5–5.0)
Alkaline Phosphatase: 61 U/L (ref 38–126)
Anion gap: 10 (ref 5–15)
BUN: 10 mg/dL (ref 6–20)
CO2: 24 mmol/L (ref 22–32)
Calcium: 9.7 mg/dL (ref 8.9–10.3)
Chloride: 108 mmol/L (ref 98–111)
Creatinine, Ser: 0.87 mg/dL (ref 0.44–1.00)
GFR calc Af Amer: >60 mL/min (ref >60)
GFR calc non Af Amer: >60 mL/min (ref >60)
Glucose, Bld: 89 mg/dL (ref 70–99)
Potassium: 3.3 mmol/L — ABNORMAL LOW (ref 3.5–5.1)
Sodium: 142 mmol/L (ref 135–145)
Total Bilirubin: 0.8 mg/dL (ref 0.3–1.2)
Total Protein: 8.3 g/dL — ABNORMAL HIGH (ref 6.5–8.1)

## 2019-06-23 LAB — LIPASE, BLOOD: Lipase: 45 U/L (ref 11–51)

## 2019-06-23 MED ORDER — SIMETHICONE 40 MG/0.6ML PO SUSP (UNIT DOSE)
40.0000 mg | Freq: Once | ORAL | Status: AC
  Administered 2019-06-23: 40 mg via ORAL
  Filled 2019-06-23: qty 0.6

## 2019-06-23 MED ORDER — IOHEXOL 9 MG/ML PO SOLN
500.0000 mL | ORAL | Status: AC
  Administered 2019-06-23 (×2): 500 mL via ORAL

## 2019-06-23 MED ORDER — IOHEXOL 300 MG/ML  SOLN
100.0000 mL | Freq: Once | INTRAMUSCULAR | Status: AC | PRN
  Administered 2019-06-23: 75 mL via INTRAVENOUS

## 2019-06-23 NOTE — ED Notes (Signed)
Pt departed for CT 

## 2019-06-23 NOTE — ED Notes (Signed)
Pt verbalized understanding of discharge instructions. NAD at this time. 

## 2019-06-23 NOTE — ED Notes (Signed)
Report to kate, rn.  

## 2019-06-23 NOTE — ED Provider Notes (Signed)
[facility] Emergency Department Provider Note ____________   First MD Initiated Contact with Patient 06/23/19 [phone]     (approximate)  I have reviewed the triage vital signs and the nursing notes.   HISTORY  Chief Complaint Abdominal Pain  HPI Kelly Jacobs is a 43 y.o. female presents to the emergency department secondary to left lower quadrant abdominal pain that is currently 10 out of 10 which patient states began on Tuesday.  Patient was seen here in this emergency department for the same without any clear etiology likewise the child in hospital.  Patient states that pain is persisted.  Patient admits to nausea however no vomiting.  Patient denies any fever.  Patient denies any urinary symptoms.  Patient denies any vaginal symptoms.     History reviewed. No pertinent past medical history.  There are no problems to display for this patient.   Past Surgical History:  Procedure Laterality Date   ABDOMINAL HYSTERECTOMY      Prior to Admission medications   Medication Sig Start Date End Date Taking? Authorizing Provider  dicyclomine (BENTYL) 20 MG tablet Take 1 tablet (20 mg total) by mouth every 6 (six) hours as needed for up to 7 days for spasms. 06/20/19 06/27/19  Lilia Pro., MD  ondansetron (ZOFRAN) 4 MG tablet Take 1 tablet (4 mg total) by mouth every 8 (eight) hours as needed for up to 7 days for nausea or vomiting. 06/20/19 06/27/19  Lilia Pro., MD    Allergies Flexeril [cyclobenzaprine], Macrobid [nitrofurantoin], Percocet [oxycodone-acetaminophen], and Ceftriaxone  No family history on file.  Social History Social History   Tobacco Use   Smoking status: Current Every Day Smoker    Packs/day: 0.50    Types: Cigarettes   Smokeless tobacco: Never Used  Substance Use Topics   Alcohol use: Yes   Drug use: No    Review of Systems Constitutional: No fever/chills Eyes: No visual changes. ENT: No sore  throat. Cardiovascular: Denies chest pain. Respiratory: Denies shortness of breath. Gastrointestinal: Positive for abdominal pain and nausea, no vomiting.  No diarrhea.  No constipation. Genitourinary: Negative for dysuria. Musculoskeletal: Negative for neck pain.  Negative for back pain. Integumentary: Negative for rash. Neurological: Negative for headaches, focal weakness or numbness.   ____________________________________________   PHYSICAL EXAM:  VITAL SIGNS: ED Triage Vitals  Enc Vitals Group     BP 06/22/19 2337 122/89     Pulse Rate 06/22/19 2337 77     Resp 06/22/19 2337 16     Temp 06/22/19 2337 (!) 97.4 F (36.3 C)     Temp Source 06/22/19 2337 Oral     SpO2 06/22/19 2337 100 %     Weight 06/22/19 2338 49.9 kg (110 lb)     Height 06/22/19 2338 1.6 m (5\' 3" )     Head Circumference --      Peak Flow --      Pain Score 06/22/19 2338 10     Pain Loc --      Pain Edu? --      Excl. in [place]? --     Constitutional: Alert and oriented.  Eyes: Conjunctivae are normal.  Mouth/Throat: Patient is wearing a mask. Neck: No stridor.  No meningeal signs.   Cardiovascular: Normal rate, regular rhythm. Good peripheral circulation. Grossly normal heart sounds. Respiratory: Normal respiratory effort.  No retractions. Gastrointestinal: Left lower quadrant tenderness to palpation.  No distention.   Musculoskeletal: No lower extremity tenderness nor edema.  No gross deformities of extremities. Neurologic:  Normal speech and language. No gross focal neurologic deficits are appreciated.  Skin:  Skin is warm, dry and intact. Psychiatric: Mood and affect are normal. Speech and behavior are normal.  ____________________________________________   LABS (all labs ordered are listed, but only abnormal results are displayed)  Labs Reviewed  COMPREHENSIVE METABOLIC PANEL - Abnormal; Notable for the following components:      Result Value   Potassium 3.3 (*)    Total Protein 8.3 (*)     All other components within normal limits  URINALYSIS, COMPLETE (UACMP) WITH MICROSCOPIC - Abnormal; Notable for the following components:   Color, Urine STRAW (*)    APPearance CLEAR (*)    Specific Gravity, Urine 1.003 (*)    Hgb urine dipstick MODERATE (*)    Bacteria, UA RARE (*)    All other components within normal limits  LIPASE, BLOOD  CBC   _____________________________  RADIOLOGY I, [place] N Elysabeth Aust, personally viewed and evaluated these images (plain radiographs) as part of my medical decision making, as well as reviewing the written report by the radiologist.  ED MD interpretation: Gaseous distention of the sigmoid colon.  No evidence of diverticulosis or diverticulitis.  No evidence of obstruction per radiologist on CT abdomen pelvis interpretation.  Official radiology report(s): CT ABDOMEN PELVIS W CONTRAST  Result Date: 06/23/2019 CLINICAL DATA:  Pt here with c/o "severe" 10/10 lower abd pain that began on Tuesday, was seen here then for the same, tearful in triage, states bloating with no abnormalities to bowel movements, denies nausea, states left side hurts more than RIGHT-sided. EXAM: CT ABDOMEN AND PELVIS WITH CONTRAST TECHNIQUE: Multidetector CT imaging of the abdomen and pelvis was performed using the standard protocol following bolus administration of intravenous contrast. CONTRAST:  30mL OMNIPAQUE IOHEXOL 300 MG/ML  SOLN COMPARISON:  CT 10/05/2017 scan which is FINDINGS: Lower chest: Lung bases are clear. Hepatobiliary: No focal hepatic lesion. Mild periportal edema. Gallbladder normal. Normal common bile duct. Benign fatty infiltration along the falciform ligament. Pancreas: Pancreas is normal. No ductal dilatation. No pancreatic inflammation. Spleen: Normal spleen Adrenals/urinary tract: Adrenal glands and kidneys are normal. The ureters and bladder normal. Stomach/Bowel: Stomach, duodenum small-bowel normal. Appendix is normal. The ascending colon is normal. Moderate  volume stool in transverse colon. The sigmoid colon is gas filled without evidence of inflammation. No evidence of diverticulosis or diverticulitis. Vascular/Lymphatic: Abdominal aorta is normal caliber. No periportal or retroperitoneal adenopathy. No pelvic adenopathy. Reproductive: Post hysterectomy. Ovaries normal. Other: No free fluid. Musculoskeletal: No aggressive osseous lesion. IMPRESSION: 1. Gaseous distended sigmoid sigmoid colon. No evidence of diverticulosis or diverticulitis. No bowel obstruction. 2. Normal appendix. 3. No obstructive uropathy. 4. Ovaries appear normal. Electronically Signed   By: Genevive Bi M.D.   On: 06/23/2019 06:39    __________________________________________ Procedures   ____________________________________________   INITIAL IMPRESSION / MDM / ASSESSMENT AND PLAN / ED COURSE  As part of my medical decision making, I reviewed the following data within the electronic MEDICAL RECORD NUMBER  43 year old female presented with above-stated history and physical exam secondary to left lower quadrant abdominal pain.  Patient states "I do not want any pain meds I just want to know stone on my belly".  Concern for possibility of diverticulitis ureterolithiasis ovarian cyst CT scan of the abdomen pelvis revealed gaseous distention of the sigmoid colon however no other gross pathology.  Patient given simethicone in the emergency department      ____________________________________________  FINAL  CLINICAL IMPRESSION(S) / ED DIAGNOSES  Final diagnoses:  Left lower quadrant abdominal pain     MEDICATIONS GIVEN DURING THIS VISIT:  Medications  iohexol (OMNIPAQUE) 9 MG/ML oral solution 500 mL (500 mLs Oral Contrast Given 06/23/19 0530)  iohexol (OMNIPAQUE) 300 MG/ML solution 100 mL (75 mLs Intravenous Contrast Given 06/23/19 0555)  simethicone (MYLICON) 40 mg/0.376ml suspension 40 mg (40 mg Oral Given 06/23/19 0843)     ED Discharge Orders    None       *Please note:  Montine CircleRhonda P Koplin was evaluated in Emergency Department on 06/23/2019 for the symptoms described in the history of present illness. She was evaluated in the context of the global COVID-19 pandemic, which necessitated consideration that the patient might be at risk for infection with the SARS-CoV-2 virus that causes COVID-19. Institutional protocols and algorithms that pertain to the evaluation of patients at risk for COVID-19 are in a state of rapid change based on information released by regulatory bodies including the CDC and federal and state organizations. These policies and algorithms were followed during the patient's care in the ED.  Some ED evaluations and interventions may be delayed as a result of limited staffing during the pandemic.*  Note:  This document was prepared using Dragon voice recognition software and may include unintentional dictation errors.   Darci CurrentBrown, [place] N, MD 06/23/19 2308

## 2019-06-23 NOTE — ED Notes (Signed)
Waiting for medication from pharmacy. Pt given phone to call for ride.

## 2019-12-25 ENCOUNTER — Emergency Department
Admission: EM | Admit: 2019-12-25 | Discharge: 2019-12-25 | Disposition: A | Discharge status: Home / Self Care | Payer: Self-pay | Attending: Emergency Medicine | Admitting: Emergency Medicine

## 2019-12-25 ENCOUNTER — Other Ambulatory Visit: Payer: Self-pay

## 2019-12-25 ENCOUNTER — Encounter: Payer: Self-pay | Admitting: Emergency Medicine

## 2019-12-25 ENCOUNTER — Emergency Department: Payer: Self-pay

## 2019-12-25 DIAGNOSIS — T7840XA Allergy, unspecified, initial encounter: Secondary | ICD-10-CM

## 2019-12-25 DIAGNOSIS — F17210 Nicotine dependence, cigarettes, uncomplicated: Secondary | ICD-10-CM | POA: Insufficient documentation

## 2019-12-25 DIAGNOSIS — N760 Acute vaginitis: Secondary | ICD-10-CM | POA: Insufficient documentation

## 2019-12-25 LAB — COMPREHENSIVE METABOLIC PANEL
ALT: 10 U/L (ref 0–44)
AST: 12 U/L — ABNORMAL LOW (ref 15–41)
Albumin: 4 g/dL (ref 3.5–5.0)
Alkaline Phosphatase: 40 U/L (ref 38–126)
Anion gap: 9 (ref 5–15)
BUN: 14 mg/dL (ref 6–20)
CO2: 25 mmol/L (ref 22–32)
Calcium: 8.7 mg/dL — ABNORMAL LOW (ref 8.9–10.3)
Chloride: 106 mmol/L (ref 98–111)
Creatinine, Ser: 1 mg/dL (ref 0.44–1.00)
GFR calc Af Amer: >60 mL/min (ref >60)
GFR calc non Af Amer: >60 mL/min (ref >60)
Glucose, Bld: 110 mg/dL — ABNORMAL HIGH (ref 70–99)
Potassium: 3.8 mmol/L (ref 3.5–5.1)
Sodium: 140 mmol/L (ref 135–145)
Total Bilirubin: 1 mg/dL (ref 0.3–1.2)
Total Protein: 7.2 g/dL (ref 6.5–8.1)

## 2019-12-25 LAB — URINALYSIS, COMPLETE (UACMP) WITH MICROSCOPIC
Bilirubin Urine: NEGATIVE
Glucose, UA: NEGATIVE mg/dL
Ketones, ur: NEGATIVE mg/dL
Leukocytes,Ua: NEGATIVE
Nitrite: NEGATIVE
Protein, ur: NEGATIVE mg/dL
Specific Gravity, Urine: 1.027 (ref 1.005–1.030)
pH: 5 (ref 5.0–8.0)

## 2019-12-25 LAB — CBC WITH DIFFERENTIAL/PLATELET
Abs Immature Granulocytes: 0.02 10*3/uL (ref 0.00–0.07)
Basophils Absolute: 0.1 10*3/uL (ref 0.0–0.1)
Basophils Relative: 1 %
Eosinophils Absolute: 0.2 10*3/uL (ref 0.0–0.5)
Eosinophils Relative: 4 %
HCT: 37.3 % (ref 36.0–46.0)
Hemoglobin: 12.9 g/dL (ref 12.0–15.0)
Immature Granulocytes: 0 %
Lymphocytes Relative: 31 %
Lymphs Abs: 1.8 10*3/uL (ref 0.7–4.0)
MCH: 31.4 pg (ref 26.0–34.0)
MCHC: 34.6 g/dL (ref 30.0–36.0)
MCV: 90.8 fL (ref 80.0–100.0)
Monocytes Absolute: 0.4 10*3/uL (ref 0.1–1.0)
Monocytes Relative: 7 %
Neutro Abs: 3.3 10*3/uL (ref 1.7–7.7)
Neutrophils Relative %: 57 %
Platelets: 197 10*3/uL (ref 150–400)
RBC: 4.11 MIL/uL (ref 3.87–5.11)
RDW: 12.5 % (ref 11.5–15.5)
WBC: 5.8 10*3/uL (ref 4.0–10.5)
nRBC: 0 % (ref 0.0–0.2)

## 2019-12-25 LAB — WET PREP, GENITAL
Sperm: NONE SEEN
Trich, Wet Prep: NONE SEEN
Yeast Wet Prep HPF POC: NONE SEEN

## 2019-12-25 LAB — PREGNANCY, URINE: Preg Test, Ur: NEGATIVE

## 2019-12-25 LAB — CHLAMYDIA/NGC RT PCR (ARMC ONLY)
Chlamydia Tr: NOT DETECTED
N gonorrhoeae: NOT DETECTED

## 2019-12-25 LAB — LIPASE, BLOOD: Lipase: 22 U/L (ref 11–51)

## 2019-12-25 MED ORDER — IOHEXOL 9 MG/ML PO SOLN
1000.0000 mL | Freq: Once | ORAL | Status: DC | PRN
  Administered 2019-12-25: 1000 mL via ORAL

## 2019-12-25 MED ORDER — ONDANSETRON HCL 4 MG/2ML IJ SOLN
4.0000 mg | Freq: Once | INTRAMUSCULAR | Status: AC
  Administered 2019-12-25: 4 mg via INTRAVENOUS
  Filled 2019-12-25: qty 2

## 2019-12-25 MED ORDER — METRONIDAZOLE 500 MG PO TABS: 500.0000 mg | ORAL_TABLET | Freq: Two times a day (BID) | ORAL | 0 refills | Status: AC

## 2019-12-25 MED ORDER — PREDNISONE 20 MG PO TABS: 40.0000 mg | ORAL_TABLET | Freq: Every day | ORAL | 0 refills | Status: DC

## 2019-12-25 MED ORDER — FAMOTIDINE IN NACL 20-0.9 MG/50ML-% IV SOLN
20.0000 mg | Freq: Once | INTRAVENOUS | Status: AC
  Administered 2019-12-25: 20 mg via INTRAVENOUS
  Filled 2019-12-25: qty 50

## 2019-12-25 MED ORDER — DIPHENHYDRAMINE HCL 50 MG/ML IJ SOLN
25.0000 mg | Freq: Once | INTRAMUSCULAR | Status: AC
  Administered 2019-12-25: 25 mg via INTRAVENOUS
  Filled 2019-12-25: qty 1

## 2019-12-25 MED ORDER — METHYLPREDNISOLONE SODIUM SUCC 125 MG IJ SOLR
125.0000 mg | INTRAMUSCULAR | Status: AC
  Administered 2019-12-25: 125 mg via INTRAVENOUS
  Filled 2019-12-25: qty 2

## 2019-12-25 MED ORDER — IOHEXOL 300 MG/ML  SOLN
75.0000 mL | Freq: Once | INTRAMUSCULAR | Status: AC | PRN
  Administered 2019-12-25: 75 mL via INTRAVENOUS

## 2019-12-25 NOTE — ED Provider Notes (Signed)
[facility] Emergency Department Provider Note   ____________________________________________   First MD Initiated Contact with Patient 12/25/19 [phone]     (approximate)  I have reviewed the triage vital signs and the nursing notes.   HISTORY  Chief Complaint Abdominal Pain    HPI Kelly Jacobs is a 44 y.o. female for no major medical history: Partial hysterectomy  Patient reports that for a few days now she has had  and increasing pain in her left lower abdomen.  Associated with mild nausea.  Still eating and drinking.  No fevers or chills.  No chest pain or shortness of breath.  She is sexually active but has had no unusual discharge.  She reports that she will occasionally get a little bit of discomfort like this last about a week and it comes in a cycle fashion as well as a feeling of breast tenderness and swelling that will come and go about monthly, the breast tenderness is not new for her and fairly typical but normally the pain in her left lower abdomen is not as intense and goes away more quickly.  Has tried ibuprofen without relief.  Patient however is very specific she does not wish for anything else for pain right now and just feeling a little nauseated  Does report concern that she had a friend who had a partial hysterectomy and had an ectopic pregnancy with but does not know of herself to be pregnant  No pain or burning with urination.  Pain does not radiate into either side of the back or kidney area.  Does have a history of STDs in the past none recent  History reviewed. No pertinent past medical history.  There are no problems to display for this patient.   Past Surgical History:  Procedure Laterality Date  . ABDOMINAL HYSTERECTOMY      Prior to Admission medications   Medication Sig Start Date End Date Taking? Authorizing Provider  dicyclomine (BENTYL) 20 MG tablet Take 1 tablet (20 mg total) by mouth every 6 (six) hours as needed for  up to 7 days for spasms. 06/20/19 06/27/19  Lilia Pro., MD  metroNIDAZOLE (FLAGYL) 500 MG tablet Take 1 tablet (500 mg total) by mouth 2 (two) times daily for 7 days. 12/25/19 01/01/20  Delman Kitten, MD  predniSONE (DELTASONE) 20 MG tablet Take 2 tablets (40 mg total) by mouth daily. 12/25/19   Delman Kitten, MD    Allergies Contrast media [iodinated diagnostic agents], Flexeril [cyclobenzaprine], Macrobid [nitrofurantoin], Percocet [oxycodone-acetaminophen], and Ceftriaxone  History reviewed. No pertinent family history.  Social History Social History   Tobacco Use  . Smoking status: Current Every Day Smoker    Packs/day: 0.50    Types: Cigarettes  . Smokeless tobacco: Never Used  Vaping Use  . Vaping Use: Never used  Substance Use Topics  . Alcohol use: Yes  . Drug use: No  Occasional alcohol not heavy  Review of Systems Constitutional: No fever/chills Eyes: No visual changes. ENT: No sore throat. Cardiovascular: Denies chest pain. Respiratory: Denies shortness of breath. Gastrointestinal: See HPI, left lower Genitourinary: Negative for dysuria. Musculoskeletal: Negative for back pain. Skin: Negative for rash. Neurological: Negative for headaches, areas of focal weakness or numbness.    ____________________________________________   PHYSICAL EXAM:  VITAL SIGNS: ED Triage Vitals  Enc Vitals Group     BP 12/25/19 0643 (!) 113/40     Pulse Rate 12/25/19 0643 64     Resp 12/25/19 0643 18  Temp 12/25/19 0643 98.5 F (36.9 C)     Temp Source 12/25/19 0643 Oral     SpO2 12/25/19 0643 100 %     Weight 12/25/19 0641 110 lb (49.9 kg)     Height 12/25/19 0641 5\' 3"  (1.6 m)     Head Circumference --      Peak Flow --      Pain Score 12/25/19 0641 7     Pain Loc --      Pain Edu? --      Excl. in [place]? --     Constitutional: Alert and oriented. Well appearing and in no acute distress. Eyes: Conjunctivae are normal. Head: Atraumatic. Nose: No  congestion/rhinnorhea. Mouth/Throat: Mucous membranes are moist. Neck: No stridor.  Cardiovascular: Normal rate, regular rhythm. Grossly normal heart sounds.  Good peripheral circulation. Respiratory: Normal respiratory effort.  No retractions. Lungs CTAB. Gastrointestinal: Soft and nontender except in the left lower and left flank she reports moderate tenderness without rebound or guarding.  Also moderate suprapubic tenderness. No distention. Gynecologic: External examination normal.  Escorted by tech 12/27/19.  Internal examination demonstrates a whitish somewhat thin discharge.  No cervical motion tenderness.  No tenderness to palpation of the vaginal canal.  No masses noted. Musculoskeletal: No lower extremity tenderness nor edema. Neurologic:  Normal speech and language. No gross focal neurologic deficits are appreciated.  Skin:  Skin is warm, dry and intact. No rash noted. Psychiatric: Mood and affect are normal. Speech and behavior are normal.  ____________________________________________   LABS (all labs ordered are listed, but only abnormal results are displayed)  Labs Reviewed  WET PREP, GENITAL - Abnormal; Notable for the following components:      Result Value   Clue Cells Wet Prep HPF POC PRESENT (*)    WBC, Wet Prep HPF POC FEW (*)    All other components within normal limits  COMPREHENSIVE METABOLIC PANEL - Abnormal; Notable for the following components:   Glucose, Bld 110 (*)    Calcium 8.7 (*)    AST 12 (*)    All other components within normal limits  URINALYSIS, COMPLETE (UACMP) WITH MICROSCOPIC - Abnormal; Notable for the following components:   Color, Urine YELLOW (*)    APPearance CLOUDY (*)    Hgb urine dipstick MODERATE (*)    Bacteria, UA RARE (*)    All other components within normal limits  CHLAMYDIA/NGC RT PCR ([facility] ONLY)  URINE CULTURE  CBC WITH DIFFERENTIAL/PLATELET  LIPASE, BLOOD  PREGNANCY, URINE    ____________________________________________  EKG   ____________________________________________  RADIOLOGY  CT ABDOMEN PELVIS W CONTRAST  Addendum Date: 12/25/2019   ADDENDUM REPORT: 12/25/2019 11:33 ADDENDUM: This addendum is given for the purpose of noting the patient had a contrast reaction which included hives and itching. The patient was treated by Dr. 12/27/2019 of the [facility] emergency department. Electronically Signed   By: [facility] M.D.   On: 12/25/2019 11:33   Result Date: 12/25/2019 CLINICAL DATA:  Left lower abdominal pain since 12/21/2019. EXAM: CT ABDOMEN AND PELVIS WITH CONTRAST TECHNIQUE: Multidetector CT imaging of the abdomen and pelvis was performed using the standard protocol following bolus administration of intravenous contrast. CONTRAST:  75 mL OMNIPAQUE IOHEXOL 300 MG/ML  SOLN COMPARISON:  CT abdomen and pelvis 06/23/2019. FINDINGS: Lower chest: Lung bases clear.  No pleural or pericardial effusion. Hepatobiliary: No focal liver abnormality is seen. No gallstones, gallbladder wall thickening, or biliary dilatation. Pancreas: Unremarkable. No pancreatic ductal dilatation or surrounding inflammatory  changes. Spleen: Normal in size without focal abnormality. Adrenals/Urinary Tract: Adrenal glands are unremarkable. Kidneys are normal, without renal calculi, focal lesion, or hydronephrosis. Bladder is unremarkable. Stomach/Bowel: Stomach is within normal limits. Appendix appears normal. No evidence of bowel wall thickening, distention, or inflammatory changes. Vascular/Lymphatic: No significant vascular findings are present. No enlarged abdominal or pelvic lymph nodes. Reproductive: Status post hysterectomy. No adnexal masses. Other: None. Musculoskeletal: Negative. IMPRESSION: Negative CT abdomen and pelvis. Electronically Signed: By: Drusilla Kanner M.D. On: 12/25/2019 11:23    CT imaging reviewed negative for acute   ____________________________________________   PROCEDURES  Procedure(s) performed: None  Procedures  Critical Care performed: No  ____________________________________________   INITIAL IMPRESSION / ASSESSMENT AND PLAN / ED COURSE  Pertinent labs & imaging results that were available during my care of the patient were reviewed by me and considered in my medical decision making (see chart for details).   Differential diagnosis includes but is not limited to, abdominal perforation, aortic dissection, cholecystitis, appendicitis, diverticulitis, colitis, esophagitis/gastritis, kidney stone, pyelonephritis, urinary tract infection, aortic aneurysm. All are considered in decision and treatment plan. Based upon the patient's presentation and risk factors, and her previous partial hysterectomy, will proceed with pelvic examination, urine for culture.  She denies overt urinary tract symptoms, lacks fever and has normal white count.  Proceed with pelvic exam to evaluate for cause and also anticipate CT scan.  I do not believe that transvaginal ultrasound would be of high utility at this point given her presentation and history of hysterectomy   Clinical Course as of Dec 24 1628  Wed Dec 25, 2019  0951 Breasts normal to visual inspection.  Escorted by nurse Geraldine Contras   [MQ]  [phone] Patient returned from CT scan noticed that she is having itching over her face and also hives on her neck.  She is awake and alert no dyspnea.  Clear lungs normal respirations.  Ambulatory no distress just reports very itchy on her neck, and she does have some small urticaria on her forehead and neck.  No oral or airway involvement.  CT tech to list allergy in her medical chart, notify patient of allergy to IV contrast as hives at this point.  Will treat   [MQ]  1155 Patient reports itching and hives improving.  She is alert oriented no distress.  Her urticaria has regressed and itching improved.   [MQ]  1224 Patient reports  that she completed course of treatment for chlamydia level as well as with her one partner approximately 2 months ago   [MQ]    Clinical Course User Index [MQ] Sharyn Creamer, MD    Patient labs reviewed negative GC.  Positive for clue cells, given her whitish discharge lower abdominal pain suprapubic discomfort I suspect possibly bacterial vaginosis.  Discussed with the patient she reports she had bacterial vaginosis multiple times in the past, will treat with Flagyl.  Careful return precautions discussed.  Patient reports itching, hives have gone away and on exam I do not see any signs of ongoing evidence of allergy or allergic reaction.  Patient updated aware of her contrast allergy  Return precautions and treatment recommendations and follow-up discussed with the patient who is agreeable with the plan.     ____________________________________________   FINAL CLINICAL IMPRESSION(S) / ED DIAGNOSES  Final diagnoses:  Bacterial vaginosis  Allergic reaction to drug, initial encounter        Note:  This document was prepared using Dragon voice recognition software and may include unintentional  dictation errors       Sharyn Creamer, MD 12/25/19 [id number]

## 2019-12-25 NOTE — ED Triage Notes (Signed)
Patient ambulatory to triage with steady gait, without difficulty or distress noted, mask in place; pt reports left lower abd pain since Saturday accomp by nausea

## 2019-12-25 NOTE — Discharge Instructions (Signed)
? ?  Please return to the emergency room right away if you are to develop a fever, severe nausea, your pain becomes severe or worsens, you are unable to keep food down, begin vomiting any dark or bloody fluid, you develop any dark or bloody stools, feel dehydrated, or other new concerns or symptoms arise. ? ?

## 2019-12-25 NOTE — ED Notes (Signed)
Patient transported to CT at this time. 

## 2019-12-25 NOTE — ED Notes (Signed)
Pelvic Cart at patient bedside.

## 2019-12-25 NOTE — Progress Notes (Signed)
Patient broke out into hives 3 minutes after contrast injection along with itching. Dr. Fanny Bien and nurse informed and patient went to ER to be cared for. Patient could breathe normally after injection. Contrast allergy documented and put into chart.

## 2019-12-25 NOTE — ED Notes (Signed)
Sandwich tray, cola, and crackers given.

## 2019-12-26 LAB — URINE CULTURE: Culture: <10000 — AB

## 2020-10-14 ENCOUNTER — Other Ambulatory Visit: Payer: Self-pay | Admitting: Family Medicine

## 2023-04-02 ENCOUNTER — Emergency Department
Admission: EM | Admit: 2023-04-02 | Discharge: 2023-04-02 | Disposition: A | Discharge status: Home / Self Care | Payer: Medicaid Other | Attending: Emergency Medicine | Admitting: Emergency Medicine

## 2023-04-02 ENCOUNTER — Other Ambulatory Visit: Payer: Self-pay

## 2023-04-02 DIAGNOSIS — M545 Low back pain, unspecified: Secondary | ICD-10-CM | POA: Diagnosis present

## 2023-04-02 DIAGNOSIS — X500XXA Overexertion from strenuous movement or load, initial encounter: Secondary | ICD-10-CM | POA: Diagnosis not present

## 2023-04-02 DIAGNOSIS — S39012A Strain of muscle, fascia and tendon of lower back, initial encounter: Secondary | ICD-10-CM | POA: Diagnosis not present

## 2023-04-02 LAB — URINALYSIS, ROUTINE W REFLEX MICROSCOPIC
Bilirubin Urine: NEGATIVE
Glucose, UA: NEGATIVE mg/dL
Ketones, ur: NEGATIVE mg/dL
Leukocytes,Ua: NEGATIVE
Nitrite: NEGATIVE
Protein, ur: NEGATIVE mg/dL
Specific Gravity, Urine: 1.003 — ABNORMAL LOW (ref 1.005–1.030)
pH: 9 — ABNORMAL HIGH (ref 5.0–8.0)

## 2023-04-02 LAB — POC URINE PREG, ED: Preg Test, Ur: NEGATIVE

## 2023-04-02 MED ORDER — ACETAMINOPHEN 500 MG PO TABS
1000.0000 mg | ORAL_TABLET | Freq: Once | ORAL | Status: AC
  Administered 2023-04-02: 1000 mg via ORAL
  Filled 2023-04-02: qty 2

## 2023-04-02 MED ORDER — METHOCARBAMOL 500 MG PO TABS: 1000.0000 mg | ORAL_TABLET | Freq: Three times a day (TID) | ORAL | 0 refills | Status: AC | PRN

## 2023-04-02 MED ORDER — KETOROLAC TROMETHAMINE 15 MG/ML IJ SOLN
15.0000 mg | Freq: Once | INTRAMUSCULAR | Status: AC
  Administered 2023-04-02: 15 mg via INTRAVENOUS
  Filled 2023-04-02: qty 1

## 2023-04-02 NOTE — ED Triage Notes (Signed)
[facility].  Tried to move a table by herself today and felt a pop in her mid/thoracic area.  C?O back pain - radiates down left leg.  No numbness/tingling.  20g LAC 50 mcg fentanyl.  VS wnl.

## 2023-04-02 NOTE — Discharge Instructions (Addendum)
You were seen in the emergency department for a muscle strain from heavy lifting.  Is important that you rested do not lift anything heavier than 10 pounds.  No twisting or bending.  Call and follow-up closely with your primary care physician, you may need a referral for physical therapy.  Return to the emergency department for any worsening pain, numbness or weakness of your legs.  You are given a prescription for a muscle relaxer.  Take only as needed.  Do not drive or work on this medication as it can make you tired.  Get over-the-counter Lidoderm patches and apply to your back.  Keep on for 12 hours and then remove.  You can alternate ice and heat.  Pain control:  Ibuprofen (motrin/aleve/advil) - You can take 3 tablets (600 mg) every 6 hours as needed for pain/fever.  Acetaminophen (tylenol) - You can take 2 extra strength tablets (1000 mg) every 6 hours as needed for pain/fever.  You can alternate these medications or take them together.  Make sure you eat food/drink water when taking these medications.  Thank you for choosing [place] for your health care, it was my pleasure to care for you today!  Corena Herter, MD

## 2023-04-02 NOTE — ED Provider Notes (Signed)
[facility] Provider Note    Event Date/Time   First MD Initiated Contact with Patient 04/02/23 1434     (approximate)   History   Back Pain   HPI  Kelly Jacobs is a 47 y.o. female presents to the emergency department with back pain.  States that she had a sudden onset of back pain whenever she was lifting up something heavy today.  States that the pain is worse on both sides of her middle back but not in the middle.  Denies any pain that radiates down her legs.  No numbness or weakness.  No recent falls or trauma.  No saddle anesthesia.  No urinary or bowel incontinence.     Physical Exam   Triage Vital Signs: ED Triage Vitals  Encounter Vitals Group     BP 04/02/23 1404 (!) 110/44     Systolic BP Percentile --      Diastolic BP Percentile --      Pulse Rate 04/02/23 1404 (!) 57     Resp 04/02/23 1404 16     Temp 04/02/23 1404 (!) 97.4 F (36.3 C)     Temp Source 04/02/23 1404 Oral     SpO2 04/02/23 1404 100 %     Weight 04/02/23 1357 110 lb 0.2 oz (49.9 kg)     Height 04/02/23 1357 5\' 3"  (1.6 m)     Head Circumference --      Peak Flow --      Pain Score --      Pain Loc --      Pain Education --      Exclude from Growth Chart --     Most recent vital signs: Vitals:   04/02/23 1404  BP: (!) 110/44  Pulse: (!) 57  Resp: 16  Temp: (!) 97.4 F (36.3 C)  SpO2: 100%    Physical Exam Constitutional:      Appearance: She is well-developed.  HENT:     Head: Atraumatic.  Eyes:     Conjunctiva/sclera: Conjunctivae normal.  Cardiovascular:     Rate and Rhythm: Regular rhythm.  Pulmonary:     Effort: No respiratory distress.  Abdominal:     General: There is no distension.  Musculoskeletal:        General: Normal range of motion.     Cervical back: Normal range of motion.     Comments: Mid thoracic paraspinal muscle tenderness to palpation bilaterally.  No midline thoracic or lumbar tenderness to palpation.  5/5 strength bilateral  lower extremities.  No saddle anesthesia.  Sensation intact.  +2 radial DP pulses that are equal bilaterally.  Skin:    General: Skin is warm.  Neurological:     Mental Status: She is alert. Mental status is at baseline.      IMPRESSION / MDM / ASSESSMENT AND PLAN / ED COURSE  I reviewed the triage vital signs and the nursing notes.  Differential diagnosis including musculoskeletal strain, back spasm, disc herniation.  No tearing chest pain and pain occurred while lifting something heavy have a low suspicion for dissection or ACS.  Do not feel that EKG or further cardiac workup is necessary at this time.  No falls or trauma have a low suspicion for acute fracture.  Do not feel that x-ray imaging is necessary at this time.  No findings concerning for upper motor neuron signs and no red flags, have a low suspicion for cauda equina or epidural compression syndrome does  not meet criteria for emergent MRI.  Labs (all labs ordered are listed, but only abnormal results are displayed) Labs interpreted as -    Labs Reviewed  URINALYSIS, ROUTINE W REFLEX MICROSCOPIC - Abnormal; Notable for the following components:      Result Value   Color, Urine COLORLESS (*)    APPearance CLEAR (*)    Specific Gravity, Urine 1.003 (*)    pH 9.0 (*)    Hgb urine dipstick SMALL (*)    Bacteria, UA RARE (*)    All other components within normal limits  POC URINE PREG, ED    No findings of urinary tract infection.  No concern for pyelonephritis.  Given IV ketorolac and Tylenol.  Will give a prescription for muscle relaxer.  Discussed NSAIDs, Lidoderm patches and follow-up with primary care physician for possible PT/OT.  Discussed return precautions for any worsening symptoms or findings that would meet criteria for emergent MRI.     PROCEDURES:  Critical Care performed: No  Procedures  Patient's presentation is most consistent with acute presentation with potential threat to life or bodily  function.   MEDICATIONS ORDERED IN ED: Medications  ketorolac (TORADOL) 15 MG/ML injection 15 mg (15 mg Intravenous Given 04/02/23 1507)  acetaminophen (TYLENOL) tablet 1,000 mg (1,000 mg Oral Given 04/02/23 1506)    FINAL CLINICAL IMPRESSION(S) / ED DIAGNOSES   Final diagnoses:  Strain of lumbar region, initial encounter     Rx / DC Orders   ED Discharge Orders          Ordered    methocarbamol (ROBAXIN) 500 MG tablet  Every 8 hours PRN        04/02/23 1501             Note:  This document was prepared using Dragon voice recognition software and may include unintentional dictation errors.   Corena Herter, MD 04/02/23 [phone]

## 2023-04-02 NOTE — ED Notes (Signed)
Pt d/c home per MD order. Discharge summary reviewed, pt verbalizes understanding. Ambulatory off unit. No s/s of acute distress noted.  

## 2023-06-10 VITALS — BP 119/78 | HR 73 | Temp 98.0°F | Resp 14 | Ht 63.0 in | Wt 110.0 lb

## 2023-06-10 DIAGNOSIS — N3001 Acute cystitis with hematuria: Secondary | ICD-10-CM

## 2023-06-10 LAB — URINALYSIS, W/ REFLEX TO CULTURE (INFECTION SUSPECTED)
Bilirubin Urine: NEGATIVE
Glucose, UA: NEGATIVE mg/dL
Ketones, ur: NEGATIVE mg/dL
Nitrite: NEGATIVE
Protein, ur: 100 mg/dL — AB
Specific Gravity, Urine: 1.025 (ref 1.005–1.030)
pH: 7 (ref 5.0–8.0)

## 2023-06-10 LAB — WET PREP, GENITAL
Clue Cells Wet Prep HPF POC: NONE SEEN
Sperm: NONE SEEN
Trich, Wet Prep: NONE SEEN
WBC, Wet Prep HPF POC: <10 — AB (ref <10)
Yeast Wet Prep HPF POC: NONE SEEN

## 2023-06-10 MED ORDER — FLUCONAZOLE 150 MG PO TABS: 150.0000 mg | ORAL_TABLET | Freq: Once | ORAL | 0 refills | Status: AC

## 2023-06-10 MED ORDER — SULFAMETHOXAZOLE-TRIMETHOPRIM 800-160 MG PO TABS
1.0000 | ORAL_TABLET | Freq: Two times a day (BID) | ORAL | 0 refills | Status: AC
Start: 2023-06-10 — End: 2023-06-15

## 2023-06-10 NOTE — ED Triage Notes (Signed)
Patient states that she started having burning when urinating and vaginal irritation after she recently had sexual intercourse.  Patient does want to do STD testing for GC and Chlamydia.

## 2023-06-10 NOTE — ED Provider Notes (Signed)
[facility]    CSN: [id number] Arrival date & time: 06/10/23  1112      History   Chief Complaint Chief Complaint  Patient presents with   Abdominal Pain    Appointment   Dysuria     HPI HPI Kelly Jacobs is a 47 y.o. female.    Kelly Jacobs presents for abdominal pressure and burning with urination that started on Tuesday night after having intercourse with her partner.  Had chlamydia before. Partner is denying symptoms.   Has  not had any antibiotics in last 30 days.   Kelly Jacobs does not use condoms regularly.  No LMP recorded. Patient has had a hysterectomy.    - Abnormal vaginal discharge: no - vaginal odor: yes  - vaginal bleeding: no - Dysuria: yes  - Hematuria: no - Urinary urgency: yes  - Urinary frequency: yes   - Fever: no - Abdominal pain: yes lower - Pelvic pain: yes  - Rash/Skin lesions/mouth ulcers: no  - Nausea: yes  - Vomiting: no  - Back Pain: yes       History reviewed. No pertinent past medical history.  There are no problems to display for this patient.   Past Surgical History:  Procedure Laterality Date   ABDOMINAL HYSTERECTOMY      OB History   No obstetric history on file.      Home Medications    Prior to Admission medications   Medication Sig Start Date End Date Taking? Authorizing Provider  fluconazole (DIFLUCAN) 150 MG tablet Take 1 tablet (150 mg total) by mouth once for 1 dose. 06/10/23 06/10/23 Yes Ridwan Bondy, DO  sulfamethoxazole-trimethoprim (BACTRIM DS) 800-160 MG tablet Take 1 tablet by mouth 2 (two) times daily for 5 days. 06/10/23 06/15/23 Yes Lequita Meadowcroft, DO  dicyclomine (BENTYL) 20 MG tablet Take 1 tablet (20 mg total) by mouth every 6 (six) hours as needed for up to 7 days for spasms. 06/20/19 06/27/19  Miguel Aschoff., MD  predniSONE (DELTASONE) 20 MG tablet Take 2 tablets (40 mg total) by mouth daily. 12/25/19   Sharyn Creamer, MD    Family History History reviewed. No pertinent family  history.  Social History Social History   Tobacco Use   Smoking status: Every Day    Current packs/day: 0.50    Types: Cigarettes   Smokeless tobacco: Never  Vaping Use   Vaping status: Never Used  Substance Use Topics   Alcohol use: Yes   Drug use: No     Allergies   Contrast media [iodinated contrast media], Flexeril [cyclobenzaprine], Macrobid [nitrofurantoin], Percocet [oxycodone-acetaminophen], and Ceftriaxone   Review of Systems Review of Systems: :negative unless otherwise stated in HPI.      Physical Exam Triage Vital Signs ED Triage Vitals  Encounter Vitals Group     BP 06/10/23 1126 119/78     Systolic BP Percentile --      Diastolic BP Percentile --      Pulse Rate 06/10/23 1126 73     Resp 06/10/23 1126 14     Temp 06/10/23 1126 98 F (36.7 C)     Temp Source 06/10/23 1126 Oral     SpO2 06/10/23 1126 96 %     Weight 06/10/23 1122 110 lb 0.2 oz (49.9 kg)     Height 06/10/23 1122 5\' 3"  (1.6 m)     Head Circumference --      Peak Flow --      Pain Score 06/10/23 1126  7     Pain Loc --      Pain Education --      Exclude from Growth Chart --    No data found.  Updated Vital Signs BP 119/78 (BP Location: Right Arm)   Pulse 73   Temp 98 F (36.7 C) (Oral)   Resp 14   Ht 5\' 3"  (1.6 m)   Wt 49.9 kg   SpO2 96%   BMI 19.49 kg/m   Visual Acuity Right Eye Distance:   Left Eye Distance:   Bilateral Distance:    Right Eye Near:   Left Eye Near:    Bilateral Near:     Physical Exam GEN: well appearing female in no acute distress  CVS: well perfused  RESP: speaking in full sentences without pause  ABD: soft, non-tender, non-distended, no palpable masses, no CVA tenderness  GU: deferred, patient performed self swab     UC Treatments / Results  Labs (all labs ordered are listed, but only abnormal results are displayed) Labs Reviewed  WET PREP, GENITAL - Abnormal; Notable for the following components:      Result Value   WBC, Wet Prep  HPF POC <10 (*)    All other components within normal limits  URINALYSIS, W/ REFLEX TO CULTURE (INFECTION SUSPECTED) - Abnormal; Notable for the following components:   Hgb urine dipstick MODERATE (*)    Protein, ur 100 (*)    Leukocytes,Ua SMALL (*)    Bacteria, UA MANY (*)    All other components within normal limits  CERVICOVAGINAL ANCILLARY ONLY    EKG   Radiology No results found.  Procedures Procedures (including critical care time)  Medications Ordered in UC Medications - No data to display  Initial Impression / Assessment and Plan / UC Course  I have reviewed the triage vital signs and the nursing notes.  Pertinent labs & imaging results that were available during my care of the patient were reviewed by me and considered in my medical decision making (see chart for details).      Patient is a 47 y.o.Marland Kitchen female  who presents for dysuria, abdominal pressure and vaginal irritation after having unprotected intercourse.  Overall patient is well-appearing and afebrile.  Vital signs stable.  UA is consistent with acute cystitis.   Hematuria supported on microscopy.  Treat with Bactrim 2 times daily for 5 days. Wet prep showing no evidence of yeast vaginitis, bacterial vaginitis or  trichomonas.  Gonorrhea and Chlamydia testing obtained.   - Treatment: Bactrim twice daily for 5 days.   - Diflucan for 1 doses for prevention of yeast infection as she has history of antibiotic associated yeast infections.  Return precautions including abdominal pain, fever, chills, nausea, or vomiting given. Discussed MDM, treatment plan and plan for follow-up with patient who agrees with plan.    Final Clinical Impressions(s) / UC Diagnoses   Final diagnoses:  Acute cystitis with hematuria     Discharge Instructions      You have a urinary tract infection but no yeast or BV.   Your STD test results will be available in the next 72 hours. If positive, someone will contact you.  You should  see your results in your MyChart account.       ED Prescriptions     Medication Sig Dispense Auth. Provider   sulfamethoxazole-trimethoprim (BACTRIM DS) 800-160 MG tablet Take 1 tablet by mouth 2 (two) times daily for 5 days. 10 tablet Katha Cabal, DO  fluconazole (DIFLUCAN) 150 MG tablet Take 1 tablet (150 mg total) by mouth once for 1 dose. 1 tablet Katha Cabal, DO      PDMP not reviewed this encounter.   Katha Cabal, DO 06/10/23 1234

## 2023-06-10 NOTE — Discharge Instructions (Signed)
You have a urinary tract infection but no yeast or BV.   Your STD test results will be available in the next 72 hours. If positive, someone will contact you.  You should see your results in your MyChart account.

## 2023-06-12 LAB — CERVICOVAGINAL ANCILLARY ONLY
Chlamydia: NEGATIVE
Comment: NEGATIVE
Comment: NORMAL
Neisseria Gonorrhea: NEGATIVE

## 2023-08-27 ENCOUNTER — Other Ambulatory Visit: Payer: Self-pay

## 2023-08-27 ENCOUNTER — Encounter (HOSPITAL_COMMUNITY): Payer: Self-pay

## 2023-08-27 ENCOUNTER — Emergency Department (HOSPITAL_COMMUNITY): Payer: Medicaid Other

## 2023-08-27 ENCOUNTER — Emergency Department (HOSPITAL_COMMUNITY)
Admission: EM | Admit: 2023-08-27 | Discharge: 2023-08-28 | Disposition: A | Discharge status: Home / Self Care | Payer: Medicaid Other | Attending: Emergency Medicine | Admitting: Emergency Medicine

## 2023-08-27 DIAGNOSIS — E876 Hypokalemia: Secondary | ICD-10-CM | POA: Diagnosis not present

## 2023-08-27 DIAGNOSIS — R072 Precordial pain: Secondary | ICD-10-CM | POA: Insufficient documentation

## 2023-08-27 DIAGNOSIS — F17200 Nicotine dependence, unspecified, uncomplicated: Secondary | ICD-10-CM | POA: Insufficient documentation

## 2023-08-27 LAB — CBC
HCT: 33.9 % — ABNORMAL LOW (ref 36.0–46.0)
Hemoglobin: 11.1 g/dL — ABNORMAL LOW (ref 12.0–15.0)
MCH: 30.2 pg (ref 26.0–34.0)
MCHC: 32.7 g/dL (ref 30.0–36.0)
MCV: 92.1 fL (ref 80.0–100.0)
Platelets: 185 10*3/uL (ref 150–400)
RBC: 3.68 MIL/uL — ABNORMAL LOW (ref 3.87–5.11)
RDW: 12.7 % (ref 11.5–15.5)
WBC: 6.5 10*3/uL (ref 4.0–10.5)
nRBC: 0 % (ref 0.0–0.2)

## 2023-08-27 LAB — BASIC METABOLIC PANEL
Anion gap: 9 (ref 5–15)
BUN: 11 mg/dL (ref 6–20)
CO2: 23 mmol/L (ref 22–32)
Calcium: 8.1 mg/dL — ABNORMAL LOW (ref 8.9–10.3)
Chloride: 111 mmol/L (ref 98–111)
Creatinine, Ser: 0.95 mg/dL (ref 0.44–1.00)
GFR, Estimated: >60 mL/min (ref >60)
Glucose, Bld: 108 mg/dL — ABNORMAL HIGH (ref 70–99)
Potassium: 3 mmol/L — ABNORMAL LOW (ref 3.5–5.1)
Sodium: 143 mmol/L (ref 135–145)

## 2023-08-27 LAB — TROPONIN I (HIGH SENSITIVITY)
Troponin I (High Sensitivity): 4 ng/L (ref <18)
Troponin I (High Sensitivity): 4 ng/L (ref <18)

## 2023-08-27 MED ORDER — SODIUM CHLORIDE 0.9% FLUSH: 3.0000 mL | Freq: Once | INTRAVENOUS | Status: DC

## 2023-08-27 NOTE — ED Triage Notes (Signed)
Pt arrived from home via [facility] c/o chest pain 8/10 throbbing upper left chest that radiates to left jaw and left upper arm. Enroute EMS adm 2x 0.4 SL nitroglycerin, 324 ASA,  4mg  zofran

## 2023-08-27 NOTE — ED Provider Triage Note (Signed)
Emergency Medicine Provider Triage Evaluation Note  Kelly Jacobs , a 48 y.o. female  was evaluated in triage.  Pt complains of chest pain. Pain to L chest radiates to L neck and arm since last night.  Persistent pain.  No fever, chills, cough, lighthead, dizzy, nausea.  Mild sob.  No hx of cardiac disease.  No trauma.   Review of Systems  Positive: As above Negative: As above  Physical Exam  BP 112/75 (BP Location: Right Arm)   Pulse 63   Temp 98.9 F (37.2 C) (Oral)   Resp 18   Ht 5\' 3"  (1.6 m)   Wt 59 kg   SpO2 100%   BMI 23.03 kg/m  Gen:   Awake, no distress   Resp:  Normal effort  MSK:   Moves extremities without difficulty  Other:    Medical Decision Making  Medically screening exam initiated at 8:50 PM.  Appropriate orders placed.  Kelly Jacobs was informed that the remainder of the evaluation will be completed by another provider, this initial triage assessment does not replace that evaluation, and the importance of remaining in the ED until their evaluation is complete.     Fayrene Helper, PA-C 08/27/23 2051

## 2023-08-28 MED ORDER — POTASSIUM CHLORIDE CRYS ER 20 MEQ PO TBCR
40.0000 meq | EXTENDED_RELEASE_TABLET | Freq: Once | ORAL | Status: AC
  Administered 2023-08-28: 40 meq via ORAL
  Filled 2023-08-28: qty 2

## 2023-08-28 NOTE — Discharge Instructions (Signed)

## 2023-08-28 NOTE — ED Provider Notes (Signed)
[facility] EMERGENCY DEPARTMENT AT [facility] Provider Note   CSN: [id number] Arrival date & time: 08/27/23  2003     History  Chief Complaint  Patient presents with   Chest Pain    Kelly Jacobs is a 48 y.o. female.  The history is provided by the patient.  Patient reports she has had chest pain since yesterday Patient reports the pain is in her left chest and left shoulder region.  No falls or trauma or heavy lifting.  It has been intermittent.  It did improve with nitroglycerin and aspirin in route.  No other associate symptoms.  She denies fever/cough/shortness of breath/diaphoresis.  No history of CAD/VTE She denies any other major medical conditions Patient is a current smoker No pleuritic pain Denies shortness of breath on my evaluation but had mentioned to nursing staff that she was short of breath Home Medications Prior to Admission medications   Not on File      Allergies    Contrast media [iodinated contrast media], Flexeril [cyclobenzaprine], Macrobid [nitrofurantoin], Percocet [oxycodone-acetaminophen], and Ceftriaxone    Review of Systems   Review of Systems  Constitutional:  Negative for fever.  Respiratory:  Negative for cough and shortness of breath.   Cardiovascular:  Positive for chest pain. Negative for leg swelling.  Gastrointestinal:  Negative for abdominal pain.  Musculoskeletal:  Positive for arthralgias.    Physical Exam Updated Vital Signs BP 120/72 (BP Location: Right Arm)   Pulse (!) 58   Temp 99 F (37.2 C) (Oral)   Resp 18   Ht 1.6 m (5\' 3" )   Wt 59 kg   SpO2 100%   BMI 23.03 kg/m  Physical Exam CONSTITUTIONAL: Well developed/well nourished HEAD: Normocephalic/atraumatic EYES: EOMI ENMT: Mucous membranes moist, poor dentition NECK: supple no meningeal signs, no bruits CV: S1/S2 noted, no murmurs/rubs/gallops noted LUNGS: Lungs are clear to auscultation bilaterally, no apparent distress Chest-no tenderness or  bruising ABDOMEN: soft, nontender NEURO: Pt is awake/alert/appropriate, moves all extremitiesx4.  No facial droop.   EXTREMITIES: pulses normal/equal, full ROM, no tenderness or edema to lower extremities No tenderness, swelling or erythema is noted to the left upper extremity or shoulder/clavicle SKIN: warm, color normal PSYCH: no abnormalities of mood noted, alert and oriented to situation  ED Results / Procedures / Treatments   Labs (all labs ordered are listed, but only abnormal results are displayed) Labs Reviewed  BASIC METABOLIC PANEL - Abnormal; Notable for the following components:      Result Value   Potassium 3.0 (*)    Glucose, Bld 108 (*)    Calcium 8.1 (*)    All other components within normal limits  CBC - Abnormal; Notable for the following components:   RBC 3.68 (*)    Hemoglobin 11.1 (*)    HCT 33.9 (*)    All other components within normal limits  TROPONIN I (HIGH SENSITIVITY)  TROPONIN I (HIGH SENSITIVITY)    EKG EKG Interpretation Date/Time:  Sunday August 27 2023 20:27:22 EST Ventricular Rate:  58 PR Interval:  152 QRS Duration:  78 QT Interval:  418 QTC Calculation: 410 R Axis:   66  Text Interpretation: Sinus bradycardia Otherwise normal ECG When compared with ECG of 20-Jun-2019 08:34, rate is slower now Confirmed by Meridee Score [phone]) on 08/27/2023 8:39:47 PM  Radiology DG Chest 2 View Result Date: 08/27/2023 CLINICAL DATA:  Chest pain EXAM: CHEST - 2 VIEW COMPARISON:  02/08/2019 FINDINGS: The heart size and mediastinal contours are  within normal limits. Both lungs are clear. The visualized skeletal structures are unremarkable. IMPRESSION: No active cardiopulmonary disease. Electronically Signed   By: Alcide Clever M.D.   On: 08/27/2023 20:50    Procedures Procedures    Medications Ordered in ED Medications - No data to display  ED Course/ Medical Decision Making/ A&P              HEART Score: 2                    Medical Decision  Making Amount and/or Complexity of Data Reviewed Labs: ordered. Radiology: ordered.   This patient presents to the ED for concern of chest pain, this involves an extensive number of treatment options, and is a complaint that carries with it a high risk of complications and morbidity.  The differential diagnosis includes but is not limited to acute coronary syndrome, aortic dissection, pulmonary embolism, pericarditis, pneumothorax, pneumonia, myocarditis, pleurisy, esophageal rupture   Social Determinants of Health: Patient's  ongoing tobacco use   increases the complexity of managing their presentation  Additional history obtained: Additional history obtained from significant other   Lab Tests: I Ordered, and personally interpreted labs.  The pertinent results include: Mild hypokalemia  Imaging Studies ordered: I ordered imaging studies including X-ray chest   I independently visualized and interpreted imaging which showed no acute findings I agree with the radiologist interpretation   Test Considered: Patient is low risk / negative by heart score, therefore do not feel that cardiac admission is indicated. The patient appears PERC negative low suspicion for PE  Complexity of problems addressed: Patient's presentation is most consistent with  acute presentation with potential threat to life or bodily function  Disposition: After consideration of the diagnostic results and the patient's response to treatment,  I feel that the patent would benefit from discharge   .           Final Clinical Impression(s) / ED Diagnoses Final diagnoses:  Precordial pain    Rx / DC Orders ED Discharge Orders     None         Zadie Rhine, MD 08/28/23 [phone]

## 2023-09-18 ENCOUNTER — Other Ambulatory Visit: Payer: Self-pay | Admitting: Physician Assistant

## 2023-09-18 DIAGNOSIS — Z1231 Encounter for screening mammogram for malignant neoplasm of breast: Secondary | ICD-10-CM
